# Patient Record
Sex: Male | Born: 1940 | Race: White | Hispanic: No | Marital: Married | Smoking: Former smoker
Health system: Southern US, Community
[De-identification: ages and names within clinical notes are randomized; demographics above are authoritative.]

## PROBLEM LIST (undated history)

## (undated) DIAGNOSIS — I4891 Unspecified atrial fibrillation: Secondary | ICD-10-CM

## (undated) DIAGNOSIS — N401 Enlarged prostate with lower urinary tract symptoms: Secondary | ICD-10-CM

## (undated) DIAGNOSIS — N2889 Other specified disorders of kidney and ureter: Secondary | ICD-10-CM

## (undated) DIAGNOSIS — S37012A Minor contusion of left kidney, initial encounter: Secondary | ICD-10-CM

## (undated) DIAGNOSIS — I456 Pre-excitation syndrome: Secondary | ICD-10-CM

## (undated) DIAGNOSIS — I82409 Acute embolism and thrombosis of unspecified deep veins of unspecified lower extremity: Secondary | ICD-10-CM

## (undated) DIAGNOSIS — N281 Cyst of kidney, acquired: Secondary | ICD-10-CM

## (undated) DIAGNOSIS — N138 Other obstructive and reflux uropathy: Secondary | ICD-10-CM

## (undated) DIAGNOSIS — N184 Chronic kidney disease, stage 4 (severe): Secondary | ICD-10-CM

## (undated) DIAGNOSIS — I2699 Other pulmonary embolism without acute cor pulmonale: Secondary | ICD-10-CM

## (undated) DIAGNOSIS — C679 Malignant neoplasm of bladder, unspecified: Secondary | ICD-10-CM

## (undated) DIAGNOSIS — R972 Elevated prostate specific antigen [PSA]: Secondary | ICD-10-CM

## (undated) HISTORY — PX: HERNIA REPAIR: SHX51

---

## 1999-11-16 HISTORY — PX: TRANSURETHRAL NEEDLE ABLATION OF THE PROSTATE: SHX2574

## 2003-11-09 ENCOUNTER — Observation Stay (HOSPITAL_COMMUNITY): Admission: EM | Admit: 2003-11-09 | Discharge: 2003-11-09 | Payer: Self-pay | Admitting: *Deleted

## 2004-08-10 ENCOUNTER — Encounter: Admission: RE | Admit: 2004-08-10 | Discharge: 2004-08-10 | Payer: Self-pay | Admitting: Nephrology

## 2006-01-16 ENCOUNTER — Ambulatory Visit: Payer: Self-pay | Admitting: Cardiology

## 2006-01-16 ENCOUNTER — Ambulatory Visit (HOSPITAL_COMMUNITY): Admission: RE | Admit: 2006-01-16 | Discharge: 2006-01-16 | Payer: Self-pay | Admitting: Family Medicine

## 2006-01-17 ENCOUNTER — Inpatient Hospital Stay (HOSPITAL_COMMUNITY): Admission: EM | Admit: 2006-01-17 | Discharge: 2006-01-22 | Payer: Self-pay | Admitting: Emergency Medicine

## 2006-01-24 ENCOUNTER — Ambulatory Visit: Payer: Self-pay | Admitting: Cardiology

## 2006-02-01 ENCOUNTER — Ambulatory Visit: Payer: Self-pay | Admitting: Cardiology

## 2006-02-08 ENCOUNTER — Ambulatory Visit: Payer: Self-pay | Admitting: Cardiology

## 2006-02-22 ENCOUNTER — Ambulatory Visit: Payer: Self-pay | Admitting: Cardiology

## 2006-03-14 ENCOUNTER — Ambulatory Visit: Payer: Self-pay | Admitting: Cardiology

## 2006-03-28 ENCOUNTER — Ambulatory Visit: Payer: Self-pay | Admitting: Cardiology

## 2006-04-18 ENCOUNTER — Ambulatory Visit: Payer: Self-pay | Admitting: Cardiology

## 2006-05-16 ENCOUNTER — Ambulatory Visit: Payer: Self-pay | Admitting: Cardiovascular Disease

## 2006-06-13 ENCOUNTER — Ambulatory Visit: Payer: Self-pay | Admitting: Cardiovascular Disease

## 2006-06-28 ENCOUNTER — Ambulatory Visit: Payer: Self-pay | Admitting: Cardiology

## 2006-07-26 ENCOUNTER — Ambulatory Visit: Payer: Self-pay | Admitting: Cardiovascular Disease

## 2006-08-12 ENCOUNTER — Ambulatory Visit: Payer: Self-pay | Admitting: Cardiology

## 2006-09-09 ENCOUNTER — Ambulatory Visit: Payer: Self-pay | Admitting: Internal Medicine

## 2006-10-07 ENCOUNTER — Ambulatory Visit: Payer: Self-pay | Admitting: Cardiology

## 2006-11-04 ENCOUNTER — Ambulatory Visit: Payer: Self-pay | Admitting: Cardiovascular Disease

## 2006-12-02 ENCOUNTER — Ambulatory Visit: Payer: Self-pay | Admitting: Cardiovascular Disease

## 2006-12-30 ENCOUNTER — Ambulatory Visit: Payer: Self-pay | Admitting: Cardiology

## 2007-02-06 ENCOUNTER — Ambulatory Visit: Payer: Self-pay | Admitting: Cardiology

## 2007-03-06 ENCOUNTER — Ambulatory Visit: Payer: Self-pay | Admitting: Cardiology

## 2007-04-03 ENCOUNTER — Ambulatory Visit: Payer: Self-pay | Admitting: Internal Medicine

## 2007-04-17 ENCOUNTER — Ambulatory Visit: Payer: Self-pay | Admitting: Cardiovascular Disease

## 2007-05-15 ENCOUNTER — Ambulatory Visit: Payer: Self-pay | Admitting: Cardiology

## 2007-06-06 ENCOUNTER — Ambulatory Visit: Payer: Self-pay | Admitting: Internal Medicine

## 2007-07-04 ENCOUNTER — Ambulatory Visit: Payer: Self-pay | Admitting: Cardiology

## 2007-07-18 ENCOUNTER — Ambulatory Visit: Payer: Self-pay | Admitting: Cardiology

## 2007-08-15 ENCOUNTER — Ambulatory Visit: Payer: Self-pay | Admitting: Cardiology

## 2007-09-12 ENCOUNTER — Ambulatory Visit: Payer: Self-pay | Admitting: Cardiology

## 2007-10-10 ENCOUNTER — Ambulatory Visit: Payer: Self-pay | Admitting: Internal Medicine

## 2007-10-31 ENCOUNTER — Ambulatory Visit: Payer: Self-pay | Admitting: Internal Medicine

## 2007-11-28 ENCOUNTER — Ambulatory Visit: Payer: Self-pay | Admitting: Cardiovascular Disease

## 2007-12-26 ENCOUNTER — Ambulatory Visit: Payer: Self-pay | Admitting: Cardiovascular Disease

## 2008-02-12 ENCOUNTER — Ambulatory Visit: Payer: Self-pay | Admitting: Cardiology

## 2008-02-14 ENCOUNTER — Ambulatory Visit: Payer: Self-pay | Admitting: Cardiology

## 2008-03-19 ENCOUNTER — Ambulatory Visit: Payer: Self-pay | Admitting: Cardiology

## 2008-04-16 ENCOUNTER — Ambulatory Visit: Payer: Self-pay | Admitting: Cardiology

## 2008-05-14 ENCOUNTER — Ambulatory Visit: Payer: Self-pay | Admitting: Cardiology

## 2008-06-11 ENCOUNTER — Ambulatory Visit: Payer: Self-pay | Admitting: Internal Medicine

## 2008-07-09 ENCOUNTER — Ambulatory Visit: Payer: Self-pay | Admitting: Cardiology

## 2008-08-06 ENCOUNTER — Ambulatory Visit: Payer: Self-pay | Admitting: Cardiology

## 2008-08-20 ENCOUNTER — Ambulatory Visit: Payer: Self-pay | Admitting: Cardiovascular Disease

## 2008-09-17 ENCOUNTER — Ambulatory Visit: Payer: Self-pay | Admitting: Internal Medicine

## 2008-10-15 ENCOUNTER — Ambulatory Visit: Payer: Self-pay | Admitting: Cardiology

## 2008-10-21 DIAGNOSIS — E785 Hyperlipidemia, unspecified: Secondary | ICD-10-CM | POA: Insufficient documentation

## 2008-10-21 DIAGNOSIS — I4891 Unspecified atrial fibrillation: Secondary | ICD-10-CM

## 2008-10-21 DIAGNOSIS — N184 Chronic kidney disease, stage 4 (severe): Secondary | ICD-10-CM

## 2008-10-21 DIAGNOSIS — I132 Hypertensive heart and chronic kidney disease with heart failure and with stage 5 chronic kidney disease, or end stage renal disease: Secondary | ICD-10-CM

## 2008-10-21 DIAGNOSIS — I2699 Other pulmonary embolism without acute cor pulmonale: Secondary | ICD-10-CM

## 2008-10-21 DIAGNOSIS — R55 Syncope and collapse: Secondary | ICD-10-CM | POA: Insufficient documentation

## 2008-10-21 DIAGNOSIS — I471 Supraventricular tachycardia: Secondary | ICD-10-CM | POA: Insufficient documentation

## 2008-11-12 ENCOUNTER — Ambulatory Visit: Payer: Self-pay | Admitting: Cardiovascular Disease

## 2008-11-15 DIAGNOSIS — S37012A Minor contusion of left kidney, initial encounter: Secondary | ICD-10-CM

## 2008-11-15 HISTORY — DX: Minor contusion of left kidney, initial encounter: S37.012A

## 2008-12-01 ENCOUNTER — Inpatient Hospital Stay (HOSPITAL_COMMUNITY): Admission: EM | Admit: 2008-12-01 | Discharge: 2008-12-05 | Payer: Self-pay | Admitting: Emergency Medicine

## 2008-12-01 ENCOUNTER — Ambulatory Visit: Payer: Self-pay | Admitting: Cardiology

## 2008-12-10 ENCOUNTER — Ambulatory Visit: Payer: Self-pay | Admitting: Cardiology

## 2008-12-10 ENCOUNTER — Inpatient Hospital Stay (HOSPITAL_COMMUNITY): Admission: EM | Admit: 2008-12-10 | Discharge: 2008-12-18 | Payer: Self-pay | Admitting: Emergency Medicine

## 2008-12-10 ENCOUNTER — Encounter (INDEPENDENT_AMBULATORY_CARE_PROVIDER_SITE_OTHER): Payer: Self-pay | Admitting: Nephrology

## 2008-12-10 ENCOUNTER — Encounter (INDEPENDENT_AMBULATORY_CARE_PROVIDER_SITE_OTHER): Payer: Self-pay | Admitting: Emergency Medicine

## 2008-12-10 ENCOUNTER — Ambulatory Visit: Payer: Self-pay | Admitting: Surgery

## 2008-12-16 ENCOUNTER — Encounter: Payer: Self-pay | Admitting: Internal Medicine

## 2008-12-20 ENCOUNTER — Ambulatory Visit: Payer: Self-pay | Admitting: Cardiovascular Disease

## 2008-12-25 ENCOUNTER — Ambulatory Visit: Payer: Self-pay | Admitting: Cardiology

## 2009-01-08 ENCOUNTER — Ambulatory Visit: Payer: Self-pay | Admitting: Cardiology

## 2009-01-13 ENCOUNTER — Ambulatory Visit: Payer: Self-pay | Admitting: Cardiology

## 2009-01-27 ENCOUNTER — Ambulatory Visit: Payer: Self-pay | Admitting: Cardiovascular Disease

## 2009-02-05 ENCOUNTER — Ambulatory Visit: Payer: Self-pay | Admitting: Internal Medicine

## 2009-02-25 ENCOUNTER — Ambulatory Visit: Payer: Self-pay | Admitting: Cardiology

## 2009-03-17 ENCOUNTER — Ambulatory Visit: Payer: Self-pay | Admitting: Cardiology

## 2009-03-21 ENCOUNTER — Ambulatory Visit: Payer: Self-pay

## 2009-03-21 ENCOUNTER — Ambulatory Visit: Payer: Self-pay | Admitting: Cardiovascular Disease

## 2009-04-04 ENCOUNTER — Ambulatory Visit: Payer: Self-pay | Admitting: Cardiology

## 2009-04-15 ENCOUNTER — Encounter: Payer: Self-pay | Admitting: *Deleted

## 2009-04-21 ENCOUNTER — Ambulatory Visit: Payer: Self-pay | Admitting: Cardiovascular Disease

## 2009-04-28 ENCOUNTER — Ambulatory Visit: Payer: Self-pay | Admitting: Cardiology

## 2009-04-28 DIAGNOSIS — R233 Spontaneous ecchymoses: Secondary | ICD-10-CM | POA: Insufficient documentation

## 2009-04-28 LAB — CONVERTED CEMR LAB
INR: 1.2
POC INR: 1.2

## 2009-04-30 LAB — CONVERTED CEMR LAB
Basophils Relative: 1.8 % (ref 0.0–3.0)
Eosinophils Relative: 3.7 % (ref 0.0–5.0)
Hemoglobin: 12.5 g/dL — ABNORMAL LOW (ref 13.0–17.0)
Lymphocytes Relative: 26.3 % (ref 12.0–46.0)
Monocytes Relative: 7.6 % (ref 3.0–12.0)
Neutro Abs: 3.7 10*3/uL (ref 1.4–7.7)
RBC: 4.02 M/uL — ABNORMAL LOW (ref 4.22–5.81)

## 2009-05-05 ENCOUNTER — Ambulatory Visit: Payer: Self-pay | Admitting: Internal Medicine

## 2009-05-05 ENCOUNTER — Ambulatory Visit: Payer: Self-pay | Admitting: Cardiology

## 2009-05-05 DIAGNOSIS — D649 Anemia, unspecified: Secondary | ICD-10-CM | POA: Insufficient documentation

## 2009-05-05 LAB — CONVERTED CEMR LAB
Basophils Relative: 0.7 % (ref 0.0–3.0)
Eosinophils Relative: 2.8 % (ref 0.0–5.0)
HCT: 36.3 % — ABNORMAL LOW (ref 39.0–52.0)
Hemoglobin: 12.8 g/dL — ABNORMAL LOW (ref 13.0–17.0)
MCV: 91.3 fL (ref 78.0–100.0)
Monocytes Absolute: 0.5 10*3/uL (ref 0.1–1.0)
Neutrophils Relative %: 62.9 % (ref 43.0–77.0)
RBC: 3.98 M/uL — ABNORMAL LOW (ref 4.22–5.81)
WBC: 6.5 10*3/uL (ref 4.5–10.5)

## 2009-05-16 ENCOUNTER — Ambulatory Visit: Payer: Self-pay | Admitting: Cardiology

## 2009-05-16 LAB — CONVERTED CEMR LAB: Prothrombin Time: 24.4 s

## 2009-05-21 ENCOUNTER — Encounter: Payer: Self-pay | Admitting: *Deleted

## 2009-05-26 ENCOUNTER — Ambulatory Visit: Payer: Self-pay | Admitting: Cardiology

## 2009-06-16 ENCOUNTER — Ambulatory Visit: Payer: Self-pay | Admitting: Internal Medicine

## 2009-06-16 LAB — CONVERTED CEMR LAB
POC INR: 2.1
Prothrombin Time: 17.9 s

## 2009-07-14 ENCOUNTER — Ambulatory Visit: Payer: Self-pay | Admitting: Cardiovascular Disease

## 2009-08-11 ENCOUNTER — Ambulatory Visit: Payer: Self-pay | Admitting: Cardiology

## 2009-08-18 ENCOUNTER — Encounter: Payer: Self-pay | Admitting: Cardiology

## 2009-08-21 ENCOUNTER — Ambulatory Visit: Payer: Self-pay | Admitting: Cardiology

## 2009-08-21 ENCOUNTER — Ambulatory Visit: Payer: Self-pay | Admitting: Internal Medicine

## 2009-08-21 DIAGNOSIS — E663 Overweight: Secondary | ICD-10-CM | POA: Insufficient documentation

## 2009-08-21 LAB — CONVERTED CEMR LAB: POC INR: 2.5

## 2009-10-01 ENCOUNTER — Ambulatory Visit: Payer: Self-pay | Admitting: Cardiovascular Disease

## 2009-10-28 ENCOUNTER — Ambulatory Visit: Payer: Self-pay | Admitting: Cardiology

## 2009-11-18 ENCOUNTER — Ambulatory Visit: Payer: Self-pay | Admitting: Cardiology

## 2009-11-18 LAB — CONVERTED CEMR LAB: POC INR: 2.2

## 2009-12-16 ENCOUNTER — Ambulatory Visit: Payer: Self-pay | Admitting: Internal Medicine

## 2009-12-31 ENCOUNTER — Telehealth (INDEPENDENT_AMBULATORY_CARE_PROVIDER_SITE_OTHER): Payer: Self-pay | Admitting: *Deleted

## 2009-12-31 ENCOUNTER — Ambulatory Visit: Payer: Self-pay | Admitting: Internal Medicine

## 2009-12-31 LAB — CONVERTED CEMR LAB: POC INR: 4.1

## 2010-01-13 ENCOUNTER — Ambulatory Visit: Payer: Self-pay | Admitting: Cardiovascular Disease

## 2010-01-13 LAB — CONVERTED CEMR LAB: POC INR: 3.9

## 2010-01-27 ENCOUNTER — Ambulatory Visit: Payer: Self-pay | Admitting: Cardiology

## 2010-01-27 LAB — CONVERTED CEMR LAB: POC INR: 2.4

## 2010-02-17 ENCOUNTER — Ambulatory Visit: Payer: Self-pay | Admitting: Internal Medicine

## 2010-02-17 LAB — CONVERTED CEMR LAB: POC INR: 2.3

## 2010-03-18 ENCOUNTER — Ambulatory Visit: Payer: Self-pay | Admitting: Cardiology

## 2010-03-18 LAB — CONVERTED CEMR LAB: POC INR: 3

## 2010-04-08 ENCOUNTER — Ambulatory Visit: Payer: Self-pay | Admitting: Internal Medicine

## 2010-04-22 ENCOUNTER — Ambulatory Visit: Payer: Self-pay | Admitting: Cardiovascular Disease

## 2010-05-20 ENCOUNTER — Ambulatory Visit: Payer: Self-pay | Admitting: Internal Medicine

## 2010-05-20 LAB — CONVERTED CEMR LAB: POC INR: 2.5

## 2010-06-17 ENCOUNTER — Ambulatory Visit: Payer: Self-pay | Admitting: Cardiovascular Disease

## 2010-07-08 ENCOUNTER — Ambulatory Visit: Payer: Self-pay | Admitting: Internal Medicine

## 2010-07-29 ENCOUNTER — Ambulatory Visit: Payer: Self-pay | Admitting: Internal Medicine

## 2010-08-20 ENCOUNTER — Ambulatory Visit (HOSPITAL_COMMUNITY): Admission: RE | Admit: 2010-08-20 | Discharge: 2010-08-20 | Payer: Self-pay | Admitting: Urology

## 2010-08-26 ENCOUNTER — Ambulatory Visit: Payer: Self-pay | Admitting: Cardiovascular Disease

## 2010-08-26 ENCOUNTER — Ambulatory Visit: Payer: Self-pay | Admitting: Cardiology

## 2010-09-04 IMAGING — CT CT ANGIO ABDOMEN
2 of 8 series · 14 of 46 positions shown, 19 images · IV contrast (agent unspecified)
Comparison: The CT abdomen pelvis of 12/01/2008

CLINICAL DATA: Left retroperitoneal bleed, evaluate for active
bleeding

CT ANGIOGRAPHY ABDOMEN
TECHNIQUE: Multidetector CT imaging of the abdomen was performed
using the standard protocol during bolus administration of
intravenous contrast. Multiplanar reconstructed images including
MIPs were obtained and reviewed to evaluate the vascular anatomy.
Contrast: 100 ml Dmnipaque-YGG the

[Series 7: abd cta 2.0 (id) · axial · 0.82mm/px · z∈[-181,+113]mm · 11 of 167 slices shown, 16 images]
[im 10/167  soft-tissue]
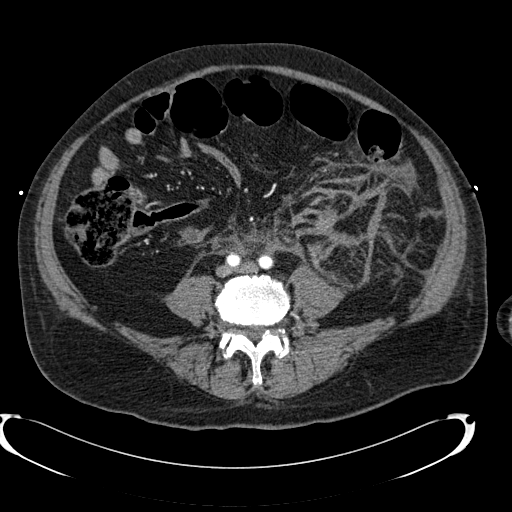
[im 10/167  bone]
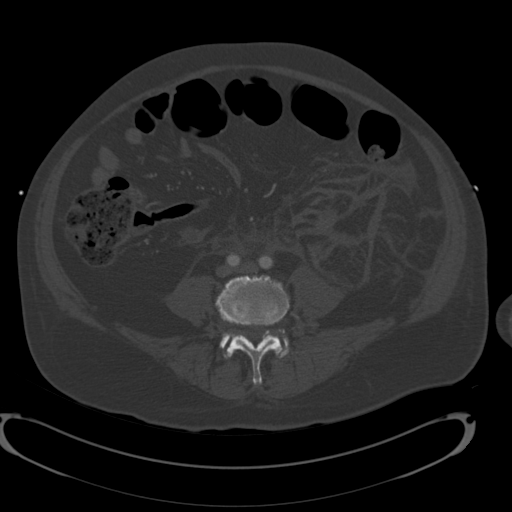
[im 30/167  soft-tissue]
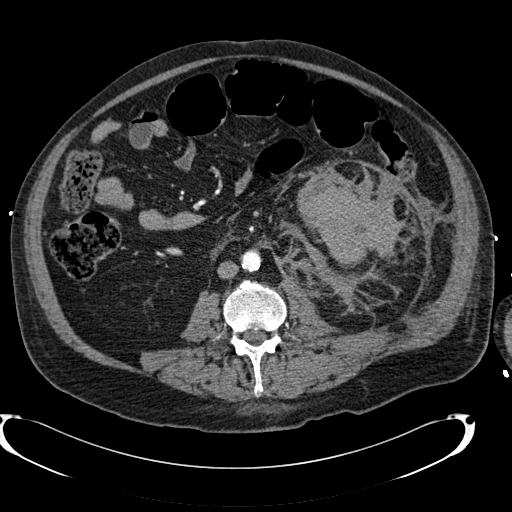
[im 49/167  soft-tissue]
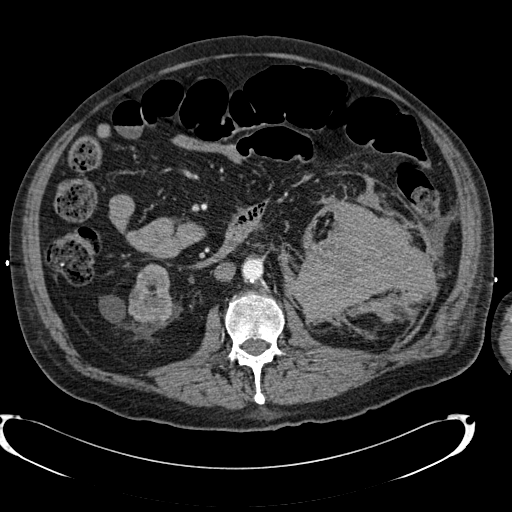
[im 59/167  soft-tissue]
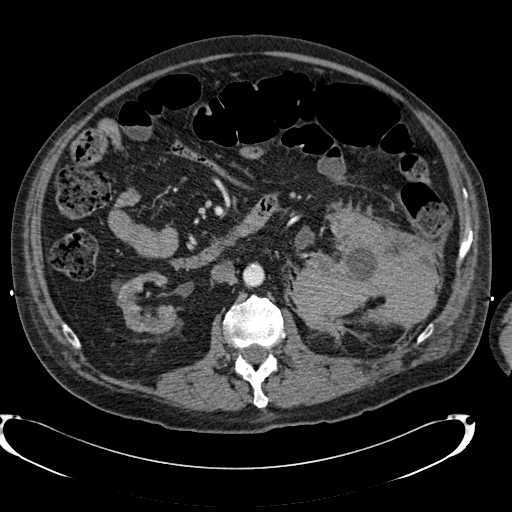
[im 79/167  soft-tissue]
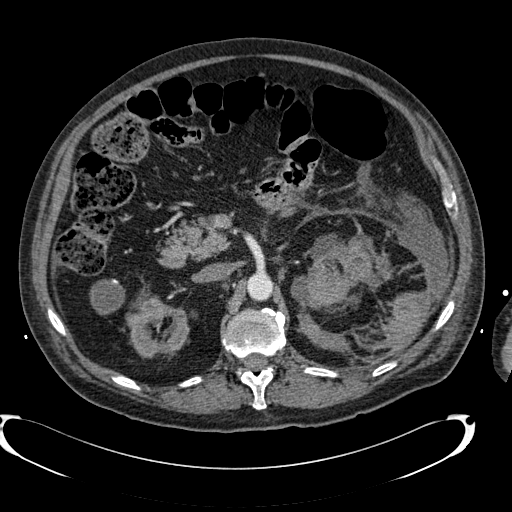
[im 88/167  soft-tissue]
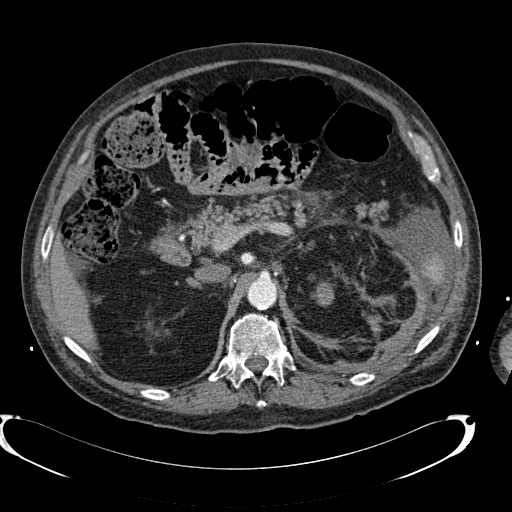
[im 108/167  soft-tissue]
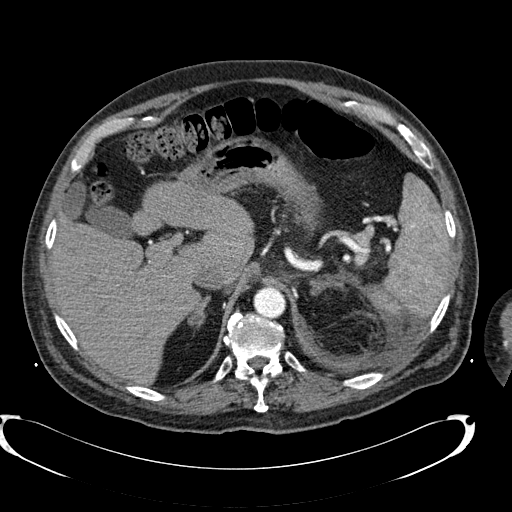
[im 127/167  soft-tissue]
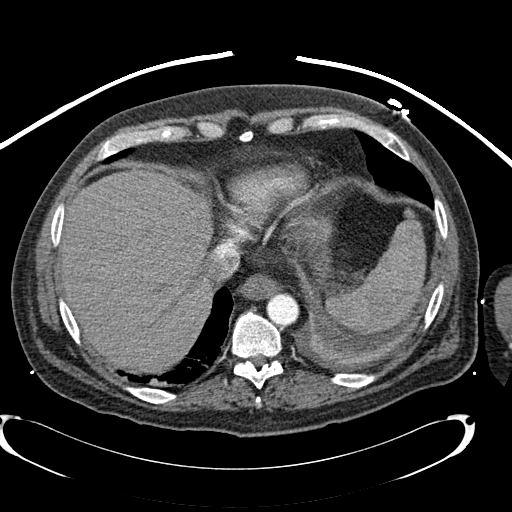
[im 127/167  lung]
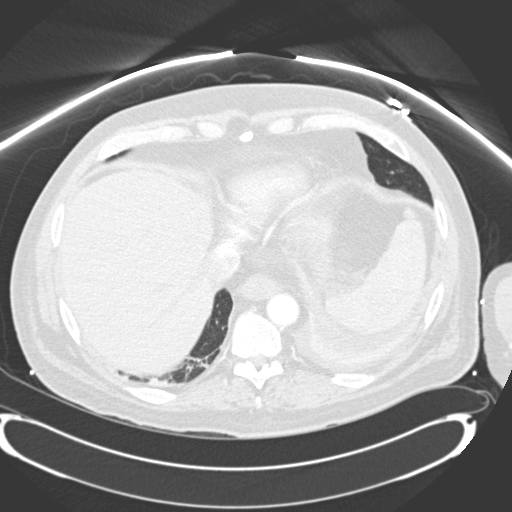
[im 137/167  soft-tissue]
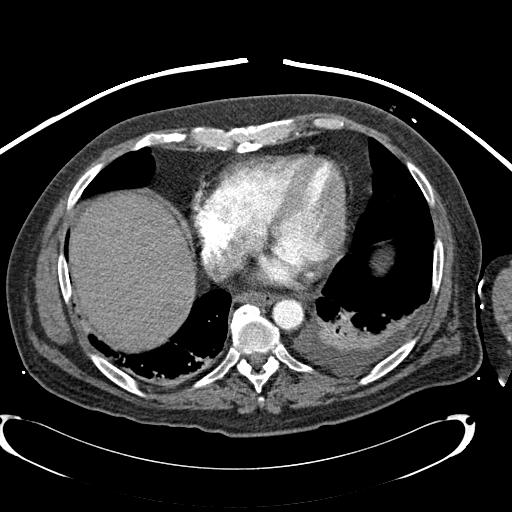
[im 137/167  lung]
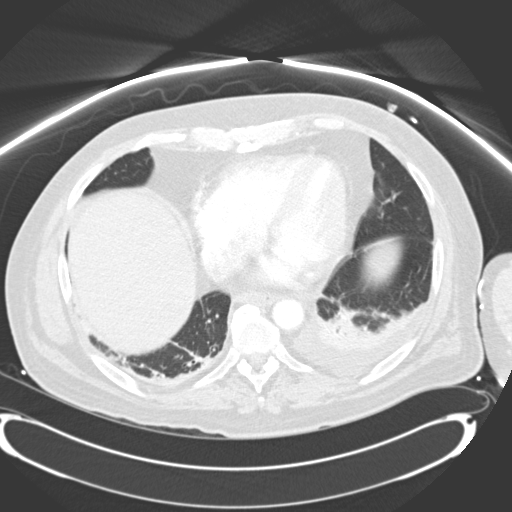
[im 137/167  bone]
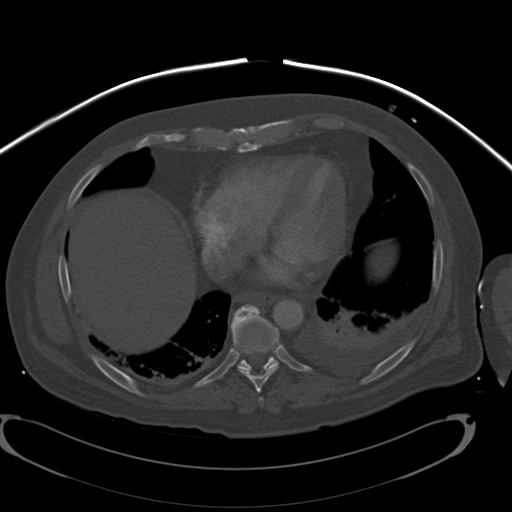
[im 147/167  lung]
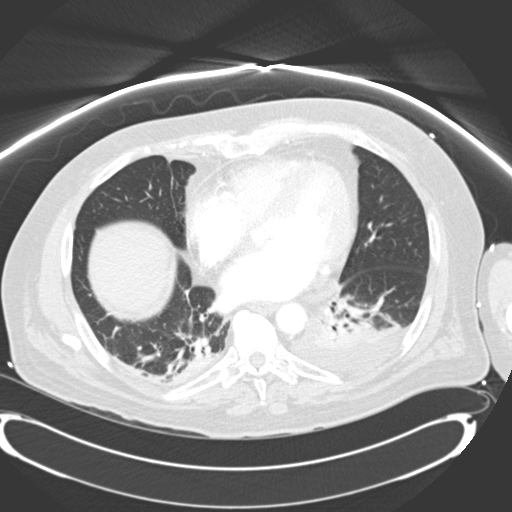
[im 157/167  soft-tissue]
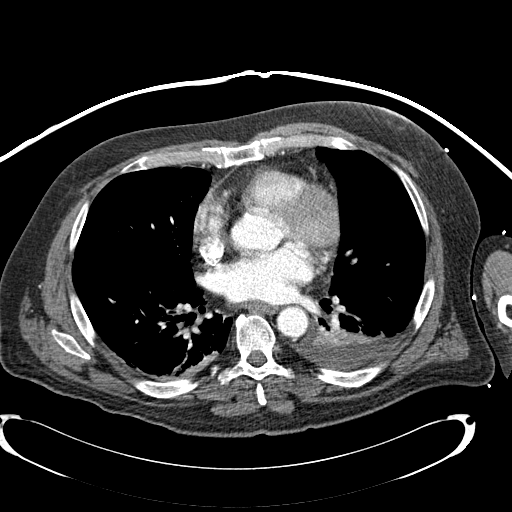
[im 157/167  lung]
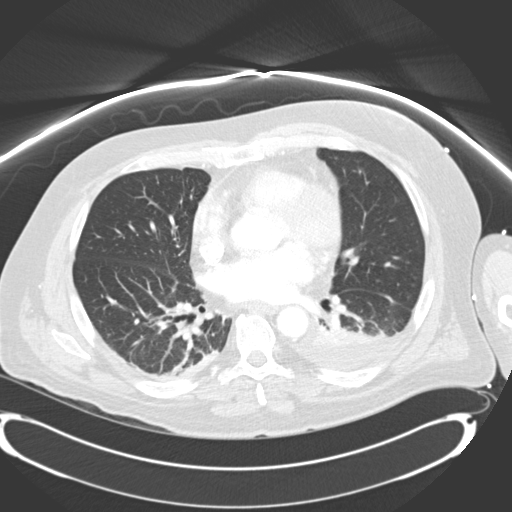

[Series 602: cor · coronal · 0.78mm/px · 3 of 150 slices shown]
[im 38/150  soft-tissue]
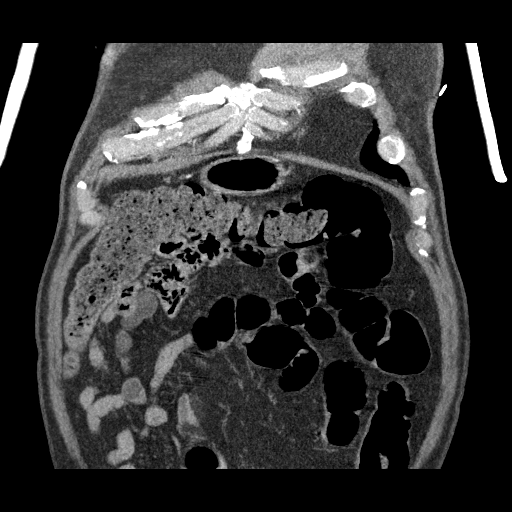
[im 75/150  soft-tissue]
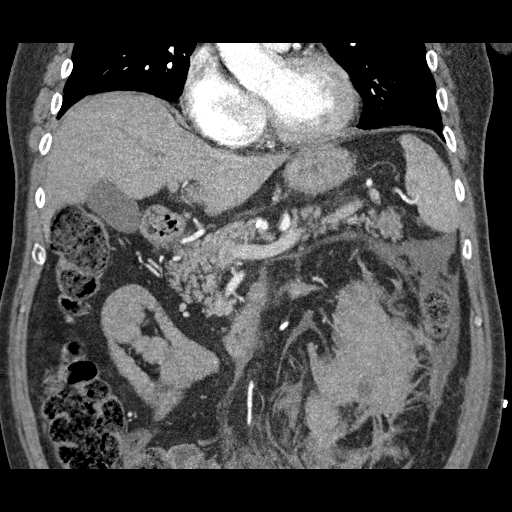
[im 112/150  soft-tissue]
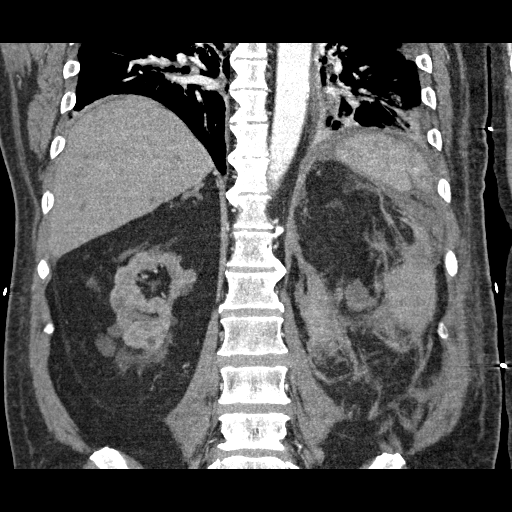

[14 of 46 positions shown; findings below may reference images not displayed]

FINDINGS: There is a left pleural effusion present. Bibasilar
atelectasis is noted left greater than right.  Pneumonia cannot
exclude particularly at the left lung base involving the posterior
left lower lobe. The origins of the great vessels are patent.  The
liver enhances with no focal abnormality.  No calcified gallstones
are seen.  The pancreas is normal in size and the pancreatic duct
is not dilated.  The adrenal glands and spleen appear stable.  As
noted previously there is a large left perinephric hematoma present
. On the arterial phase images there appear to be three left renal
arteries present.  However no active extravasation is seen.
Multiple low attenuation renal lesions are present, one on the
right with peripheral calcification.  A small amount of ascites is
noted along the tip of the spleen inferiorly.  The IMA is patent.
The left retroperitoneal hematoma extends into the pelvis.
IMPRESSION: No active extravasation is seen.  There has been no change in the
size of the left perinephric / left retroperitoneal hematoma.

## 2010-09-04 IMAGING — CT CT PELVIS W/O CM
2 of 4 series · 16 of 46 positions shown, 18 images · non-contrast
Comparison: Abdominal MRI 08/10/2004.

CT ABDOMEN

CLINICAL DATA: Left back and flank pain.  History of renal failure
and kidney stones.

CT OF THE ABDOMEN AND PELVIS WITHOUT CONTRAST (CT UROGRAM)
TECHNIQUE: Multidetector CT imaging was performed through the
abdomen and pelvis to include the urinary tract.

[Series 3: >200 +reformat 5.0 b31f st · axial · 0.81mm/px · z∈[+792,+1252]mm · 13 of 102 slices shown, 15 images]
[im 5/102  soft-tissue]
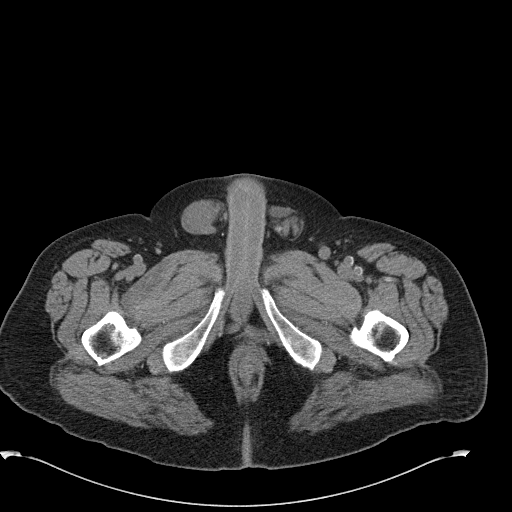
[im 5/102  bone]
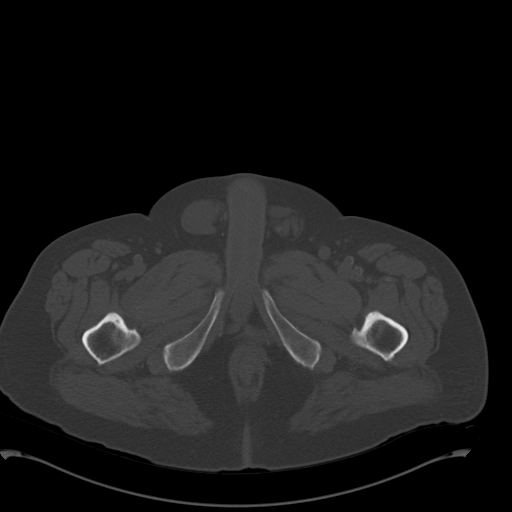
[im 13/102  soft-tissue]
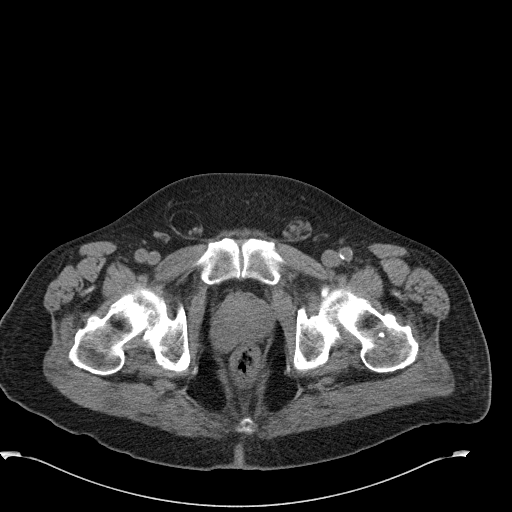
[im 22/102  soft-tissue]
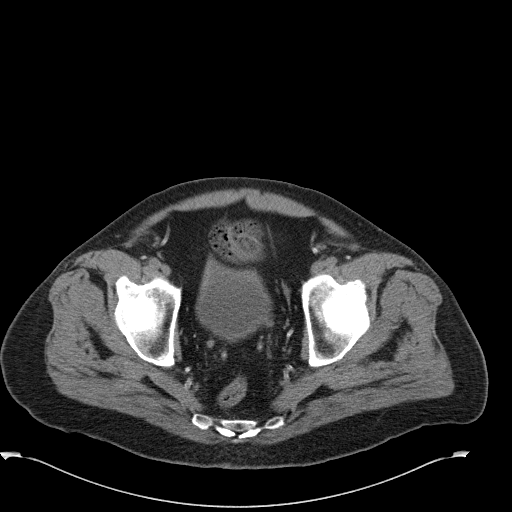
[im 30/102  soft-tissue]
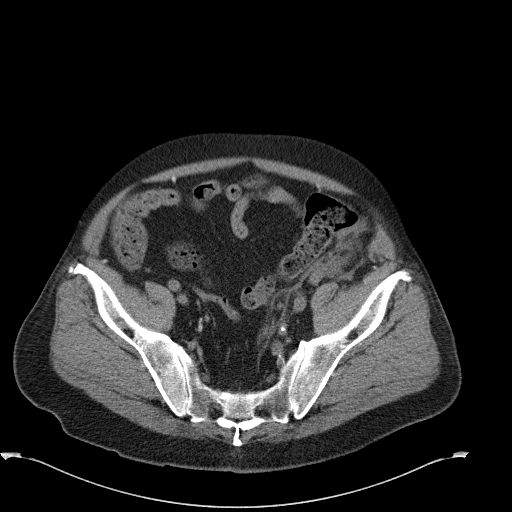
[im 34/102  soft-tissue]
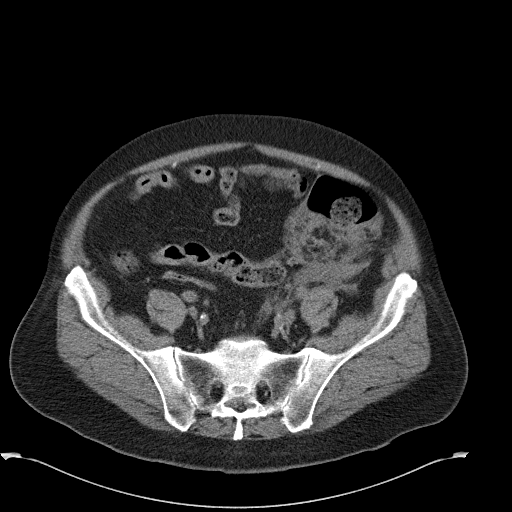
[im 43/102  soft-tissue]
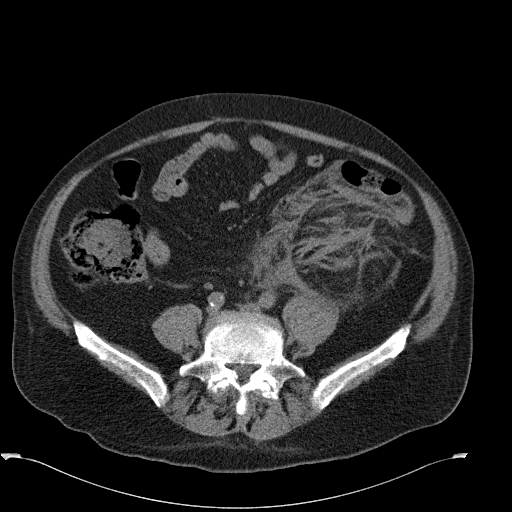
[im 51/102  soft-tissue]
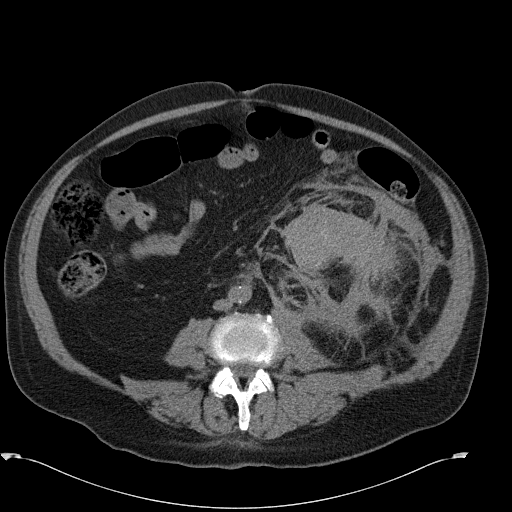
[im 59/102  soft-tissue]
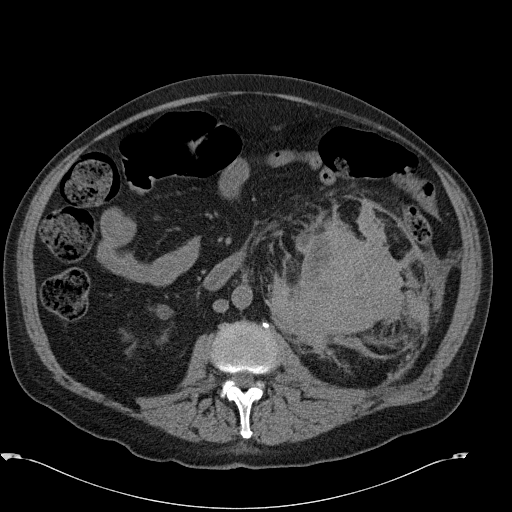
[im 68/102  soft-tissue]
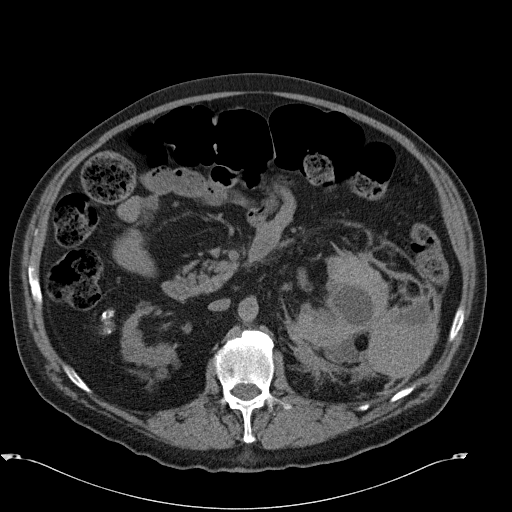
[im 68/102  bone]
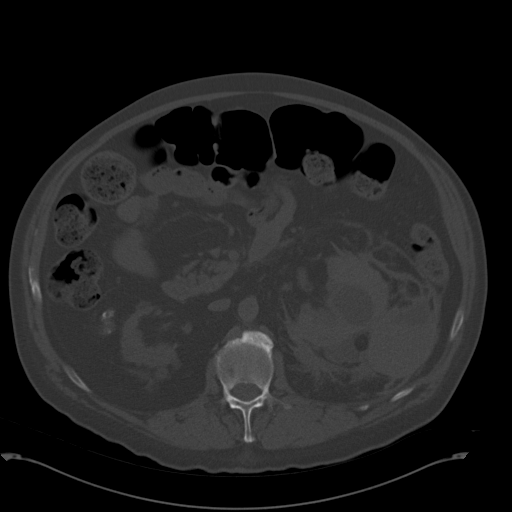
[im 72/102  soft-tissue]
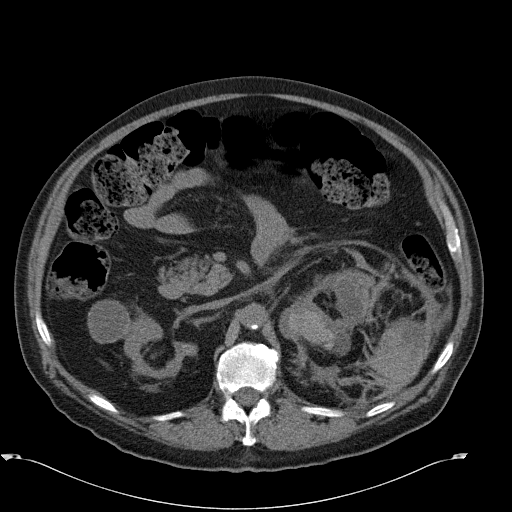
[im 80/102  soft-tissue]
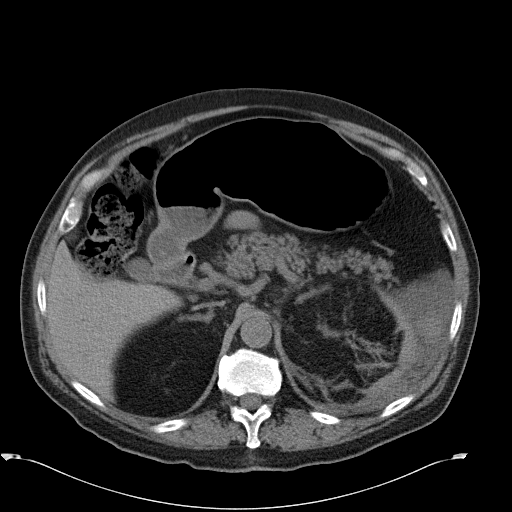
[im 89/102  soft-tissue]
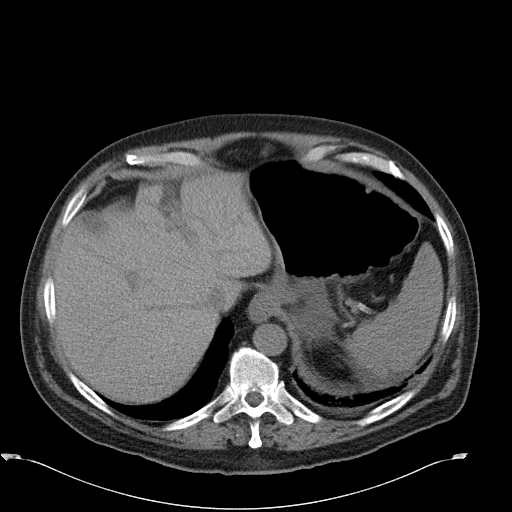
[im 97/102  soft-tissue]
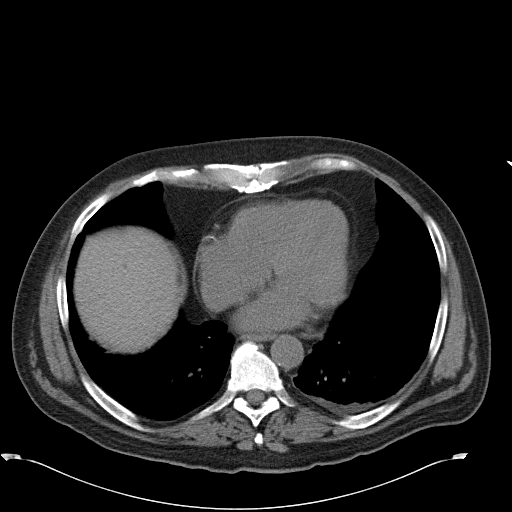

[Series 5: >200 +reformat 2.0 spo cor st · coronal · 0.99mm/px · 3 of 136 slices shown]
[im 46/136  soft-tissue]
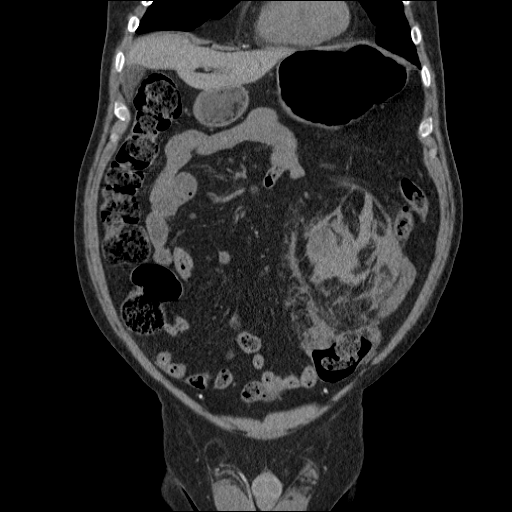
[im 61/136  soft-tissue]
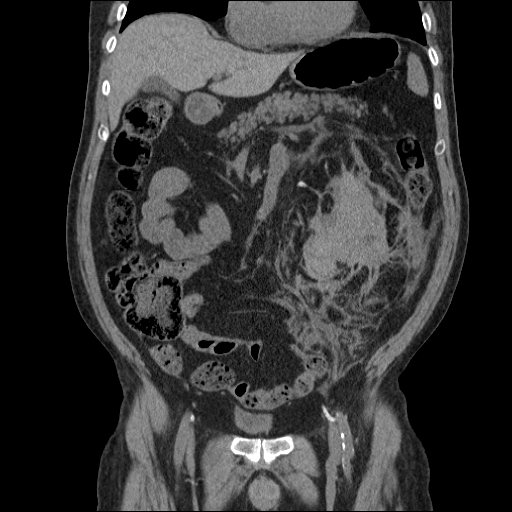
[im 76/136  soft-tissue]
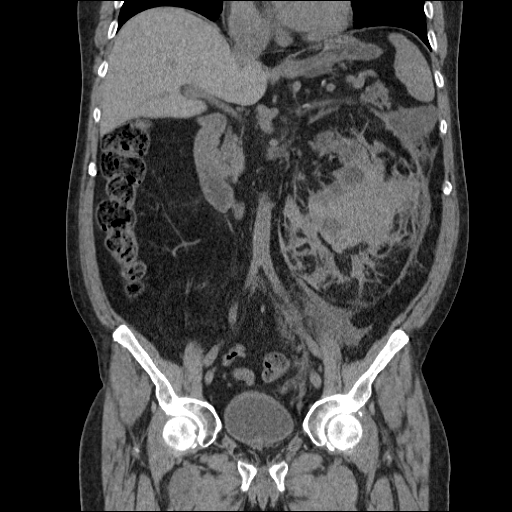

[16 of 46 positions shown; findings below may reference images not displayed]

FINDINGS: There is atelectasis at both lung bases with a small
left pleural effusion.  There is a large complex retroperitoneal
hematoma on the left which obscures the left kidney.  This hematoma
measures approximately 12.0 x 9.3 cm transverse.  There are fluid
fluid levels within the left perinephric space.  As correlated with
the prior examination, the underlying kidney demonstrates multiple
cystic lesions.  No renal or ureteral calculi are identified.

The right kidney demonstrates cortical thinning with multiple low-
density lesions.  A complex peripherally calcified cyst extending
from the interpolar region of the right kidney measures 3.8 x
cm on image 32, not significantly changed from the prior MRI.

There is no generalized ascites.  A small amount of fluid is
present in the left pericolic gutter and the lesser sac.  The
liver, spleen, gallbladder, pancreas and adrenal glands appear
unremarkable.  No bowel lesions are identified.
IMPRESSION: 1.  Large acute perinephric hematoma on the left, largely obscuring
the left kidney.  This hemorrhage likely arises from the left
kidney.  A specific etiology is not demonstrated by this
examination.  Followup is warranted.
2.  No evidence of urinary tract calculus or hydronephrosis.
3.  Grossly stable appearance of the right kidney with multiple
complex cysts.

CT PELVIS
FINDINGS: The perinephric hematoma on the left extends into the
false pelvis.  There is no evidence of ureteral dilatation or
ureteral calculus.  The bladder and prostate gland appear
unremarkable.  There is no pelvic ascites.
IMPRESSION: 1.  Inferior extension of perinephric hematoma into the false
pelvis on the left.
2.  No evidence of ureteral calculus or hydronephrosis.

Critical test results telephoned to Dr. Choy at the time of
interpretation on 12/01/2008 at 8288 hours.

## 2010-09-23 ENCOUNTER — Ambulatory Visit: Payer: Self-pay | Admitting: Cardiovascular Disease

## 2010-09-30 ENCOUNTER — Ambulatory Visit: Payer: Self-pay | Admitting: Cardiology

## 2010-09-30 LAB — CONVERTED CEMR LAB: POC INR: 2.6

## 2010-10-14 ENCOUNTER — Ambulatory Visit: Payer: Self-pay | Admitting: Internal Medicine

## 2010-10-14 LAB — CONVERTED CEMR LAB: POC INR: 2.2

## 2010-11-04 ENCOUNTER — Ambulatory Visit: Payer: Self-pay | Admitting: Cardiology

## 2010-11-04 LAB — CONVERTED CEMR LAB: POC INR: 2

## 2010-12-02 ENCOUNTER — Ambulatory Visit: Admission: RE | Admit: 2010-12-02 | Discharge: 2010-12-02 | Payer: Self-pay | Source: Home / Self Care

## 2010-12-02 LAB — CONVERTED CEMR LAB: POC INR: 1.4

## 2010-12-15 NOTE — Medication Information (Signed)
Summary: rov/tm  Anticoagulant Therapy  Managed by: Bethena Midget, RN, BSN Referring MD: Rollene Rotunda MD PCP: Dr. 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 no    Recent/future dental: no  Any missed doses?: no       Is patient compliant with meds? yes      Comments: Pt will increase leafy veggies  Allergies: No Known Drug Allergies  Anticoagulation Management History:      The patient is taking warfarin and comes in today for a routine follow up visit.  Positive risk factors for bleeding include an age of 70 years or older.  The bleeding index is 'intermediate risk'.  Negative CHADS2 values include Age > 1 years old.  The start date was 01/18/2006.  His last INR was 1.2.  Anticoagulation responsible Chon Buhl: Riley Kill MD, Maisie Fus.  INR POC: 2.7.  Cuvette Lot#: [id number].  Exp: 08/2011.    Anticoagulation Management Assessment/Plan:      The patient's current anticoagulation dose is Coumadin 5 mg tabs: Take as directed by coumadin clinic..  The target INR is 2.0-2.5.  The next INR is due 07/08/2010.  Anticoagulation instructions were given to patient.  Results were reviewed/authorized by Bethena Midget, RN, BSN.  He was notified by Bethena Midget, RN, BSN.         Prior Anticoagulation Instructions: INR 2.5 Continue 2.5mg s everyday except 5mg s on Mondays. Recheck in 4 weeks.   Current Anticoagulation Instructions: INR 2.7 On Thursday only take 1mg  then resume 2.5mg s everyday except 5mg s on Mondays. Recheck in 2 weeks.  Sample 1mg s Lot # [id number] Exp 09/2010

## 2010-12-15 NOTE — Medication Information (Signed)
Summary: rov/tm  Anticoagulant Therapy  Managed by: Cloyde Reams, RN, BSN Referring MD: Rollene Rotunda MD PCP: Dr. 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  Positive risk factors for bleeding include an age of 36 years or older.  The bleeding index is 'intermediate risk'.  Negative CHADS2 values include Age > 30 years old.  The start date was 01/18/2006.  His last INR was 1.2.  Anticoagulation responsible provider: Clifton James MD, Cristal Deer.  INR POC: 3.9.  Cuvette Lot#: [id number].  Exp: 03/2011.    Anticoagulation Management Assessment/Plan:      The patient's current anticoagulation dose is Coumadin 5 mg tabs: Take as directed by coumadin clinic..  The target INR is 2.0-2.5.  The next INR is due 01/27/2010.  Anticoagulation instructions were given to patient.  Results were reviewed/authorized by Cloyde Reams, RN, BSN.  He was notified by Cloyde Reams RN.         Prior Anticoagulation Instructions: INR 4.1 Skip today's dose then resume regular dose of 2.5mg s everyday except 5mg s on Mondays and Fridays. Exception this Friday take only 2.5mg s. Recheck in 2 weeks.   Current Anticoagulation Instructions: INR 3.9  Skip tomorrow's dosage of coumadin then start taking 1/2 tablet daily except 1 tablet on Mondays.  Recheck in 2 weeks.

## 2010-12-15 NOTE — Medication Information (Signed)
Summary: rov/ewj  Anticoagulant Therapy  Managed by: Bethena Midget, RN, BSN Referring MD: Rollene Rotunda MD PCP: Dr. 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  Positive risk factors for bleeding include an age of 70 years or older.  The bleeding index is 'intermediate risk'.  Negative CHADS2 values include Age > 5 years old.  The start date was 01/18/2006.  His last INR was 1.2.  Anticoagulation responsible provider: Graciela Husbands MD, Viviann Spare.  INR POC: 2.3.  Cuvette Lot#: [id number].  Exp: 03/2011.    Anticoagulation Management Assessment/Plan:      The patient's current anticoagulation dose is Coumadin 5 mg tabs: Take as directed by coumadin clinic..  The target INR is 2.0-2.5.  The next INR is due 03/17/2010.  Anticoagulation instructions were given to patient.  Results were reviewed/authorized by Bethena Midget, RN, BSN.  He was notified by Bethena Midget, RN, BSN.         Prior Anticoagulation Instructions: INR 2.4  Continue on same dosage 1/2 tablet daily except 1 tablet on Mondays.  Recheck in 3 weeks.    Current Anticoagulation Instructions: INR 2.3 Continue 2.5mg s daily except 5mg s on Mondays. Recheck in 4 weeks.   Appended Document: [facility]    Anticoagulant Therapy  Managed by: Bethena Midget, RN, BSN Referring MD: Rollene Rotunda MD PCP: Dr. 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 no    Recent/future dental: no  Any missed doses?: no       Is patient compliant with meds? yes       Allergies: No Known Drug Allergies  Anticoagulation Management History:      Positive risk factors for bleeding include an age of 70 years or older.  The bleeding index is 'intermediate risk'.  Negative CHADS2 values include Age > 70 years old.  The start date was 01/18/2006.  His last INR was 1.2.  Anticoagulation responsible provider: Graciela Husbands MD, Viviann Spare.  Exp: 03/2011.    Anticoagulation Management Assessment/Plan:      The patient's current anticoagulation dose is Coumadin 5 mg tabs: Take as directed by coumadin clinic..  The target INR is 2.0-2.5.  The next INR is due 03/17/2010.  Anticoagulation instructions were given to patient.  Results were reviewed/authorized by Bethena Midget, RN, BSN.         Prior Anticoagulation Instructions: INR 2.3 Continue 2.5mg s daily except 5mg s on Mondays. Recheck in 4 weeks.

## 2010-12-15 NOTE — Medication Information (Signed)
Summary: [facility]  Anticoagulant Therapy  Managed by: Lyna Poser, PharmD Referring MD: Rollene Rotunda MD PCP: Dr. 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   Any changes in medication regimen? yes       Details: started trimethoprim 2 weeks ago. Has 2 weeks left.   Recent/future dental: no  Any missed doses?: no       Is patient compliant with meds? yes      Comments: [facility] and they said the antibiotic was trimethoprim by itself not bactrim.   Allergies: No Known Drug Allergies  Anticoagulation Management History:      The patient is taking warfarin and comes in today for a routine follow up visit.  Positive risk factors for bleeding include an age of 70 years or older.  The bleeding index is 'intermediate risk'.  Negative CHADS2 values include Age > 47 years old.  The start date was 01/18/2006.  His last INR was 1.2.  Anticoagulation responsible provider: Myrtis Ser MD,Jeffrey.  INR POC: 2.6.  Cuvette Lot#: [id number].  Exp: 09/2011.    Anticoagulation Management Assessment/Plan:      The patient's current anticoagulation dose is Coumadin 5 mg tabs: Take as directed by coumadin clinic., Warfarin sodium 5 mg tabs: Take as directed.  The target INR is 2.0-2.5.  The next INR is due 10/14/2010.  Anticoagulation instructions were given to patient.  Results were reviewed/authorized by Lyna Poser, PharmD.         Prior Anticoagulation Instructions: INR 3.1  Coumadin 5mg  tab, NO COUMADIN today then 1/2 tab each day   Current Anticoagulation  Instructions: INR 2.6 Continue taking a half tablet everyday. Recheck in 2 weeks.

## 2010-12-15 NOTE — Medication Information (Signed)
Summary: rov/ewj  Anticoagulant Therapy  Managed by: Cloyde Reams, RN, BSN Referring MD: Rollene Rotunda MD PCP: Dr. 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 no    Recent/future dental: no  Any missed doses?: no       Is patient compliant with meds? yes       Allergies (verified): No Known Drug Allergies  Anticoagulation Management History:      The patient is taking warfarin and comes in today for a routine follow up visit.  Positive risk factors for bleeding include an age of 69 years or older.  The bleeding index is 'intermediate risk'.  Negative CHADS2 values include Age > 55 years old.  The start date was 01/18/2006.  His last INR was 1.2.  Anticoagulation responsible provider: Clifton James MD, Cristal Deer.  INR POC: 2.4.  Cuvette Lot#: [id number].  Exp: 03/2011.    Anticoagulation Management Assessment/Plan:      The patient's current anticoagulation dose is Coumadin 5 mg tabs: Take as directed by coumadin clinic..  The target INR is 2.0-2.5.  The next INR is due 02/17/2010.  Anticoagulation instructions were given to patient.  Results were reviewed/authorized by Cloyde Reams, RN, BSN.  He was notified by Cloyde Reams RN.         Prior Anticoagulation Instructions: INR 3.9  Skip tomorrow's dosage of coumadin then start taking 1/2 tablet daily except 1 tablet on Mondays.  Recheck in 2 weeks.    Current Anticoagulation Instructions: INR 2.4  Continue on same dosage 1/2 tablet daily except 1 tablet on Mondays.  Recheck in 3 weeks.

## 2010-12-15 NOTE — Medication Information (Signed)
Summary: rov/eac  Anticoagulant Therapy  Managed by: Weston Brass, PharmD Referring MD: Rollene Rotunda MD PCP: Dr. 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 no    Recent/future dental: no  Any missed doses?: no       Is patient compliant with meds? yes       Allergies: No Known Drug Allergies  Anticoagulation Management History:      The patient is taking warfarin and comes in today for a routine follow up visit.  Positive risk factors for bleeding include an age of 51 years or older.  The bleeding index is 'intermediate risk'.  Negative CHADS2 values include Age > 52 years old.  The start date was 01/18/2006.  His last INR was 1.2.  Anticoagulation responsible provider: Jens Som MD, Arlys John.  INR POC: 3.0.  Cuvette Lot#: [id number].  Exp: 04/2011.    Anticoagulation Management Assessment/Plan:      The patient's current anticoagulation dose is Coumadin 5 mg tabs: Take as directed by coumadin clinic..  The target INR is 2.0-2.5.  The next INR is due 04/08/2010.  Anticoagulation instructions were given to patient.  Results were reviewed/authorized by Weston Brass, PharmD.  He was notified by Weston Brass PharmD.         Prior Anticoagulation Instructions: INR 2.3 Continue 2.5mg s daily except 5mg s on Mondays. Recheck in 4 weeks.   Current Anticoagulation Instructions: INR 3.0  Skip tomorrow's dose of Coumadin then resume same dose of 1/2 tablet every day except 1 tablet on Monday

## 2010-12-15 NOTE — Medication Information (Signed)
Summary: [name]  Anticoagulant Therapy  Managed by: Bethena Midget, RN, BSN Referring MD: Rollene Rotunda MD PCP: Dr. 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 no    Recent/future dental: no  Any missed doses?: no       Is patient compliant with meds? yes       Allergies: No Known Drug Allergies  Anticoagulation Management History:      The patient is taking warfarin and comes in today for a routine follow up visit.  Positive risk factors for bleeding include an age of 70 years or older.  The bleeding index is 'intermediate risk'.  Negative CHADS2 values include Age > 64 years old.  The start date was 01/18/2006.  His last INR was 1.2.  Anticoagulation responsible provider: Juanda Chance MD, Smitty Cords.  INR POC: 2.2.  Cuvette Lot#: [id number].  Exp: 12/2010.    Anticoagulation Management Assessment/Plan:      The patient's current anticoagulation dose is Coumadin 5 mg tabs: Take as directed by coumadin clinic..  The target INR is 2.0-2.5.  The next INR is due 12/16/2009.  Anticoagulation instructions were given to patient.  Results were reviewed/authorized by Bethena Midget, RN, BSN.  He was notified by Bethena Midget, RN, BSN.         Prior Anticoagulation Instructions: INR 3.0   Skip tomorrow's dose of coumadin then resume same dosage 1/2 tablet daily except 1 tablet on Mondays and Fridays.   Recheck in 3 weeks.     Current Anticoagulation Instructions: INR 2.2 Continue 1/2 pill everyday except 1pill on Mondays and Fridays. Recheck in 4 weeks.

## 2010-12-15 NOTE — Progress Notes (Signed)
Summary: nose and mouth bleeding. check INR today.   Phone Note Call from Patient   Caller: patient wife Call For: CVRR Summary of Call: Patient hasn't been eating right due to flu.  Has started having gum and nose bleed 1 week ago and Mrs. Grondin just found out.  Initial call taken by: Bethena Midget, RN, BSN,  December 31, 2009 11:43 AM  Follow-up for Phone Call        Will see today.   Follow-up by: Bethena Midget, RN, BSN,  December 31, 2009 11:43 AM

## 2010-12-15 NOTE — Medication Information (Signed)
Summary: rov/tm  Anticoagulant Therapy  Managed by: Bethena Midget, RN, BSN Referring MD: Rollene Rotunda MD PCP: Dr. 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 no    Recent/future dental: no  Any missed doses?: no       Is patient compliant with meds? yes       Allergies: No Known Drug Allergies  Anticoagulation Management History:      The patient is taking warfarin and comes in today for a routine follow up visit.  Positive risk factors for bleeding include an age of 70 years or older.  The bleeding index is 'intermediate risk'.  Negative CHADS2 values include Age > 70 years old.  The start date was 01/18/2006.  His last INR was 1.2.  Anticoagulation responsible provider: Aris Moman MD, Fayrene Fearing.  INR POC: 2.5.  Cuvette Lot#: [id number].  Exp: 07/2011.    Anticoagulation Management Assessment/Plan:      The patient's current anticoagulation dose is Coumadin 5 mg tabs: Take as directed by coumadin clinic..  The target INR is 2.0-2.5.  The next INR is due 06/17/2010.  Anticoagulation instructions were given to patient.  Results were reviewed/authorized by Bethena Midget, RN, BSN.  He was notified by Bethena Midget, RN, BSN.         Prior Anticoagulation Instructions: INR 2.5 Continue 2.5mg s everyday except 5mg s on Mondays. Recheck in 3 weeks.   Current Anticoagulation Instructions: INR 2.5 Continue 2.5mg s everyday except 5mg s on Mondays. Recheck in 4 weeks.

## 2010-12-15 NOTE — Medication Information (Signed)
Summary: rov/mw  Anticoagulant Therapy  Managed by: Weston Brass, PharmD Referring MD: Rollene Rotunda MD PCP: Dr. 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 yes       Details: Still on Trimethoprim, unsure of stop date but dose was decreased by 1/2 due to MD concerns about "blood work"  Recent/future dental: no  Any missed doses?: no       Is patient compliant with meds? yes      Comments: Pt will call us back with instructions for trimethoprim to deteremine if appointment date needs to be changed  Allergies: No Known Drug Allergies  Anticoagulation Management History:      The patient is taking warfarin and comes in today for a routine follow up visit.  Positive risk factors for bleeding include an age of 70 years or older.  The bleeding index is 'intermediate risk'.  Negative CHADS2 values include Age > 10 years old.  The start date was 01/18/2006.  His last INR was 1.2.  Anticoagulation responsible Makailee Nudelman: Bensimhon MD, Reuel Boom.  INR POC: 2.2.  Cuvette Lot#: [id number].  Exp: 10/2011.    Anticoagulation Management Assessment/Plan:      The patient's current anticoagulation dose is Coumadin 5 mg tabs: Take as directed by coumadin clinic., Warfarin sodium 5 mg tabs: Take as directed.  The target INR is 2.0-2.5.  The next INR is due 11/04/2010.  Anticoagulation instructions were given to patient.  Results were reviewed/authorized by Weston Brass, PharmD.  He was notified by Hoy Register, PharmD Candidate.         Prior Anticoagulation Instructions: INR 2.6 Continue taking a half tablet everyday. Recheck in 2 weeks.     Current Anticoagulation Instructions: INR 2.2 Continue previous dose of 0.5 tablet everyday Recheck INR in 3 weeks unless trimethoprim changes

## 2010-12-15 NOTE — Medication Information (Signed)
Summary: rov/tm  Anticoagulant Therapy  Managed by: Bethena Midget, RN, BSN Referring MD: Rollene Rotunda MD PCP: Dr. 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 nostril off and on for about week. Esp when he blows nose. Also gum bleeding   Recent/future hospitalizations: no    Any changes in medication regimen? yes       Details: Tylenol PRN and 2 ASA once at bedtime.   Recent/future dental: no  Any missed doses?: no       Is patient compliant with meds? yes       Allergies: No Known Drug Allergies  Anticoagulation Management History:      The patient comes in today for his initial visit for anticoagulation therapy.  Positive risk factors for bleeding include an age of 70 years or older.  The bleeding index is 'intermediate risk'.  Negative CHADS2 values include Age > 1 years old.  The start date was 01/18/2006.  His last INR was 1.2.  Anticoagulation responsible provider: Lakesha Levinson MD, Fayrene Fearing.  INR POC: 4.1.  Cuvette Lot#: [id number].  Exp: 02/2011.    Anticoagulation Management Assessment/Plan:      The patient's current anticoagulation dose is Coumadin 5 mg tabs: Take as directed by coumadin clinic..  The target INR is 2.0-2.5.  The next INR is due 01/13/2010.  Anticoagulation instructions were given to patient.  Results were reviewed/authorized by Bethena Midget, RN, BSN.  He was notified by Bethena Midget, RN, BSN.         Prior Anticoagulation Instructions: INR 2.0 Continue 2.5mg s everyday except 5mg s on Mondays and Fridays. Recheck in 4 weeks.   Current  Anticoagulation Instructions: INR 4.1 Skip today's dose then resume regular dose of 2.5mg s everyday except 5mg s on Mondays and Fridays. Exception this Friday take only 2.5mg s. Recheck in 2 weeks.

## 2010-12-15 NOTE — Assessment & Plan Note (Signed)
Summary: 1 year 427.31  Medications Added CALCITRIOL 0.5 MCG CAPS (CALCITRIOL) 5 days a week      Allergies Added: NKDA  Visit Type:  Follow-up Primary Provider:  Dr. Fidela Salisbury  CC:  Atrial Fibrillation.  History of Present Illness: The patient  returns for followup. Since I last saw him he has done quite well. He has stable renal insufficiency and reports that his creatinine is around 6 but he has no indication for dialysis. He has tolerated Coumadin and has had no bleeding issues. He denies any chest discomfort, neck or arm discomfort. He denies any palpitations, presyncope or syncope. He has no PND or orthopnea. He is active though he is not walking or exercising routinely.  Current Medications (verified): 1)  Coumadin 5 Mg Tabs (Warfarin Sodium) .... Take As Directed By Coumadin Clinic. 2)  Metoprolol Succinate 100 Mg Xr24h-Tab (Metoprolol Succinate) .... 2 By Mouth Daily 3)  Cardizem Cd 300 Mg Xr24h-Cap (Diltiazem Hcl Coated Beads) .... Daily 4)  Stool Softner .... Daily 5)  Laxative .... As Needed 6)  Calcitriol 0.5 Mcg Caps (Calcitriol) .... 5 Days A Week 7)  Fosrenol 1000 Mg Chew (Lanthanum Carbonate) .Marland Kitchen.. 1 Tablet By Mouth Three Times A Day With Meals  Allergies (verified): No Known Drug Allergies  Past History:  Past Medical History: Reviewed history from 08/21/2009 and no changes required. 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  Status post inferior vena caval     filter December 10, 2008. Previous history of right lower extremity deep vein     thrombosis/pulmonary embolism March 2007. 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 Left heel pain secondary to probable Achilles tendinitis.  Review of Systems       As stated in the HPI and negative for all other systems.   Vital Signs:  Patient profile:   70 year old male Height:      69 inches Weight:      204 pounds BMI:     30.23 Pulse rate:   72 / minute Resp:     16 per minute BP sitting:   132 / 87  (right arm)  Vitals Entered By: Marrion Coy, CNA (August 26, 2010 9:03 AM)  Physical Exam  General:  Well developed, well nourished, in no acute distress. Head:  normocephalic and atraumatic Eyes:  PERRLA/EOM intact; conjunctiva and lids normal. Mouth:  Teeth, gums and palate normal. Oral mucosa normal. Neck:  Neck supple, no JVD. No masses, thyromegaly or abnormal cervical nodes. Chest Wall:  no deformities or breast masses noted Lungs:  Clear bilaterally to auscultation and percussion. Heart:  Non-displaced PMI, chest non-tender;irregular rate and rhythm, S1, S2 without murmurs, rubs or gallops. Carotid upstroke normal, no bruit. Normal abdominal aortic size, no bruits. Femorals normal pulses, no bruits. Mild  edema. Abdomen:  Bowel sounds positive; abdomen soft and non-tender without masses, organomegaly, or hernias noted. No hepatosplenomegaly, obese Msk:  Back normal, normal gait. Muscle strength and tone normal. Extremities:  trace left pedal edema and trace right pedal edema.   Neurologic:  Alert and oriented x 3. Skin:  Intact without lesions or rashes. Psych:  Normal affect.    EKG  Procedure date:  08/26/2010  Findings:  atrial fibrillation, rate 72, right bundle branch block, right axis deviation, no acute ST-T wave changes  Impression & Recommendations:  Problem # 1:  ATRIAL FIBRILLATION (ICD-427.31) He tolerates this rhythm well and has normal rate control and anticoagulation. A change in therapy is indicated.  Problem # 2:  PULMONARY EMBOLISM (ICD-415.19) With this issue he would not be a Pradaxa candidate. He  continues on anticoagulation with Coumadin.  Problem # 3:  * WPW Olegario Messier has had no further symptomatic tachyarrhythmias. No change in therapy is indicated.  Patient Instructions: 1)  Your physician recommends that you schedule a follow-up appointment in: 1 year 2)  Your physician recommends that you continue on your current medications as directed. Please refer to the Current Medication list given to you today.  Appended Document: Orders Update    Clinical Lists Changes  Orders: Added new Service order of EKG w/ Interpretation (93000) - Signed

## 2010-12-15 NOTE — Medication Information (Signed)
Summary: rov/tm  Anticoagulant Therapy  Managed by: Weston Brass, PharmD Referring MD: Rollene Rotunda MD PCP: Dr. 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 no    Recent/future dental: no  Any missed doses?: no       Is patient compliant with meds? yes       Allergies: No Known Drug Allergies  Anticoagulation Management History:      The patient is taking warfarin and comes in today for a routine follow up visit.  Positive risk factors for bleeding include an age of 70 years or older.  The bleeding index is 'intermediate risk'.  Negative CHADS2 values include Age > 82 years old.  The start date was 01/18/2006.  His last INR was 1.2.  Anticoagulation responsible provider: Allred MD, Fayrene Fearing.  INR POC: 2.3.  Cuvette Lot#: [id number].  Exp: 09/2011.    Anticoagulation Management Assessment/Plan:      The patient's current anticoagulation dose is Coumadin 5 mg tabs: Take as directed by coumadin clinic..  The target INR is 2.0-2.5.  The next INR is due 08/26/2010.  Anticoagulation instructions were given to patient.  Results were reviewed/authorized by Weston Brass, PharmD.  He was notified by Weston Brass PharmD.         Prior Anticoagulation Instructions: INR 2.6 Today only take 1mg s then resume 2.5mg s everyday except 5mg s on Mondays. Recheck in 3 weeks.   1mg s Lot # [id number] Exp 09/2010 # 1 tablet  Current Anticoagulation Instructions: INR 2.3  Continue same dose of 1/2 tablets every day except 1 tablet on Monday.  Recheck INR in 4 weeks.

## 2010-12-15 NOTE — Medication Information (Signed)
Summary: CCR      Allergies Added: NKDA Anticoagulant Therapy  Managed by: Reina Fuse, PharmD Referring MD: Rollene Rotunda MD PCP: Dr. 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 no    Recent/future dental: no  Any missed doses?: no       Is patient compliant with meds? yes       Current Medications (verified): 1)  Coumadin 5 Mg Tabs (Warfarin Sodium) .... Take As Directed By Coumadin Clinic. 2)  Metoprolol Succinate 100 Mg Xr24h-Tab (Metoprolol Succinate) .... 2 By Mouth Daily 3)  Cardizem Cd 300 Mg Xr24h-Cap (Diltiazem Hcl Coated Beads) .... Daily 4)  Stool Softner .... Daily 5)  Laxative .... As Needed 6)  Calcitriol 0.5 Mcg Caps (Calcitriol) .... Take One Tablet Daily 7)  Fosrenol 1000 Mg Chew (Lanthanum Carbonate) .Marland Kitchen.. 1 Tablet By Mouth Three Times A Day With Meals  Allergies (verified): No Known Drug Allergies  Anticoagulation Management History:      The patient is taking warfarin and comes in today for a routine follow up visit.  Positive risk factors for bleeding include an age of 70 years or older.  The bleeding index is 'intermediate risk'.  Negative CHADS2 values include Age > 60 years old.  The start date was 01/18/2006.  His last INR was 1.2.  Anticoagulation responsible provider: Eden Emms MD, Theron Arista.  INR POC: 2.5.  Cuvette Lot#: [id number].  Exp: 09/2011.    Anticoagulation Management Assessment/Plan:      The patient's current anticoagulation dose is Coumadin 5 mg tabs: Take as directed by coumadin clinic..  The target INR is 2.0-2.5.  The next INR is due 09/23/2010.  Anticoagulation instructions  were given to patient.  Results were reviewed/authorized by Reina Fuse, PharmD.  He was notified by Reina Fuse PharmD.         Prior Anticoagulation Instructions: INR 2.3  Continue same dose of 1/2 tablets every day except 1 tablet on Monday.  Recheck INR in 4 weeks.   Current Anticoagulation Instructions: INR 2.5  Continue taking Coumadin 0.5 tab (2.5 mg) every day except Coumadin 1 tab (5 mg) on Mondays. Return to clinic in 4 weeks.

## 2010-12-15 NOTE — Medication Information (Signed)
Summary: rov/sl  Medications Added WARFARIN SODIUM 5 MG TABS (WARFARIN SODIUM) Take as directed      Allergies Added: NKDA Anticoagulant Therapy  Managed by: Leota Sauers, PharmD, BCPS, CPP Referring MD: Rollene Rotunda MD PCP: Dr. 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 yes       Details: new ABX unsure name day# 5/30  Recent/future dental: no  Any missed doses?: no       Is patient compliant with meds? yes       Current Medications (verified): 1)  Coumadin 5 Mg Tabs (Warfarin Sodium) .... Take As Directed By Coumadin Clinic. 2)  Metoprolol Succinate 100 Mg Xr24h-Tab (Metoprolol Succinate) .... 2 By Mouth Daily 3)  Cardizem Cd 300 Mg Xr24h-Cap (Diltiazem Hcl Coated Beads) .... Daily 4)  Stool Softner .... Daily 5)  Laxative .... As Needed 6)  Calcitriol 0.5 Mcg Caps (Calcitriol) .... 5 Days A Week 7)  Fosrenol 1000 Mg Chew (Lanthanum Carbonate) .Marland Kitchen.. 1 Tablet By Mouth Three Times A Day With Meals 8)  Warfarin Sodium 5 Mg Tabs (Warfarin Sodium) .... Take As Directed  Allergies (verified): No Known Drug Allergies  Anticoagulation Management History:      Positive risk factors for bleeding include an age of 68 years or older.  The bleeding index is 'intermediate risk'.  Negative CHADS2 values include Age > 10 years old.  The start date was 01/18/2006.  His last INR was 1.2.  Anticoagulation responsible provider: Eden Emms MD, Theron Arista.  INR POC: 3.1.  Exp: 09/2011.    Anticoagulation Management Assessment/Plan:      The patient's current anticoagulation dose is Coumadin 5 mg tabs: Take as directed by coumadin  clinic., Warfarin sodium 5 mg tabs: Take as directed.  The target INR is 2.0-2.5.  The next INR is due 10/07/2010.  Anticoagulation instructions were given to patient.  Results were reviewed/authorized by Leota Sauers, PharmD, BCPS, CPP.         Prior Anticoagulation Instructions: INR 2.5  Continue taking Coumadin 0.5 tab (2.5 mg) every day except Coumadin 1 tab (5 mg) on Mondays. Return to clinic in 4 weeks.   Current Anticoagulation Instructions: INR 3.1  Coumadin 5mg  tab, NO COUMADIN today then 1/2 tab each day   Appended Document: rov/sl pt called later - ABX is Bactrium - continue current dose instructions but will recheck INR in 1week [facility]

## 2010-12-15 NOTE — Medication Information (Signed)
Summary: rov/sp  Anticoagulant Therapy  Managed by: Weston Brass, PharmD Referring MD: Rollene Rotunda MD PCP: Dr. 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 no    Recent/future dental: no  Any missed doses?: no       Is patient compliant with meds? yes       Allergies: No Known Drug Allergies  Anticoagulation Management History:      The patient comes in today for his initial visit for anticoagulation therapy.  Positive risk factors for bleeding include an age of 70 years or older.  The bleeding index is 'intermediate risk'.  Negative CHADS2 values include Age > 57 years old.  The start date was 01/18/2006.  His last INR was 1.2.  Anticoagulation responsible provider: Brystol Wasilewski MD, Fayrene Fearing.  INR POC: 3.5.  Cuvette Lot#: [id number].  Exp: 06/2011.    Anticoagulation Management Assessment/Plan:      The patient's current anticoagulation dose is Coumadin 5 mg tabs: Take as directed by coumadin clinic..  The target INR is 2.0-2.5.  The next INR is due 04/17/2010.  Anticoagulation instructions were given to patient.  Results were reviewed/authorized by Weston Brass, PharmD.         Prior Anticoagulation Instructions: INR 3.0  Skip tomorrow's dose of Coumadin then resume same dose of 1/2 tablet every day except 1 tablet on Monday    Current Anticoagulation Instructions: INR-3.5 Hold dose for 04/09/10. tomorrow and resume normal schedule. Take 1 tablet on Monday and 1/2 a tablet on all other days. Patient to increase amount of greens in diet. Recheck in 10 days.

## 2010-12-15 NOTE — Medication Information (Signed)
Summary: rov/tm  Anticoagulant Therapy  Managed by: Bethena Midget, RN, BSN Referring MD: Rollene Rotunda MD PCP: Dr. 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 no    Recent/future dental: no  Any missed doses?: no       Is patient compliant with meds? yes       Allergies: No Known Drug Allergies  Anticoagulation Management History:      The patient is taking warfarin and comes in today for a routine follow up visit.  Positive risk factors for bleeding include an age of 70 years or older.  The bleeding index is 'intermediate risk'.  Negative CHADS2 values include Age > 11 years old.  The start date was 01/18/2006.  His last INR was 1.2.  Anticoagulation responsible provider: Juanda Chance MD, Smitty Cords.  INR POC: 2.0.  Cuvette Lot#: [id number].  Exp: 02/2011.    Anticoagulation Management Assessment/Plan:      The patient's current anticoagulation dose is Coumadin 5 mg tabs: Take as directed by coumadin clinic..  The target INR is 2.0-2.5.  The next INR is due 01/13/2010.  Anticoagulation instructions were given to patient.  Results were reviewed/authorized by Bethena Midget, RN, BSN.  He was notified by Bethena Midget, RN, BSN.         Prior Anticoagulation Instructions: INR 2.2 Continue 1/2 pill everyday except 1pill on Mondays and Fridays. Recheck in 4 weeks.   Current Anticoagulation Instructions: INR 2.0 Continue 2.5mg s everyday except 5mg s on Mondays and Fridays. Recheck in 4 weeks.

## 2010-12-15 NOTE — Medication Information (Signed)
Summary: ccr  Anticoagulant Therapy  Managed by: Bethena Midget, RN, BSN Referring MD: Rollene Rotunda MD PCP: Dr. 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 no    Recent/future dental: no  Any missed doses?: no       Is patient compliant with meds? yes       Allergies: No Known Drug Allergies  Anticoagulation Management History:      The patient is taking warfarin and comes in today for a routine follow up visit.  Positive risk factors for bleeding include an age of 70 years or older.  The bleeding index is 'intermediate risk'.  Negative CHADS2 values include Age > 60 years old.  The start date was 01/18/2006.  His last INR was 1.2.  Anticoagulation responsible provider: Eden Emms MD, Theron Arista.  INR POC: 2.5.  Cuvette Lot#: [id number].  Exp: 06/2011.    Anticoagulation Management Assessment/Plan:      The patient's current anticoagulation dose is Coumadin 5 mg tabs: Take as directed by coumadin clinic..  The target INR is 2.0-2.5.  The next INR is due 05/20/2010.  Anticoagulation instructions were given to patient.  Results were reviewed/authorized by Bethena Midget, RN, BSN.  He was notified by Bethena Midget, RN, BSN.         Prior Anticoagulation Instructions: INR-3.5 Hold dose for 04/09/10. tomorrow and resume normal schedule. Take 1 tablet on Monday and 1/2 a tablet on all other days. Patient to increase amount of greens in diet. Recheck in 10 days.  Current Anticoagulation Instructions: INR 2.5 Continue 2.5mg s everyday except 5mg s on Mondays. Recheck in 3 weeks.

## 2010-12-15 NOTE — Medication Information (Signed)
Summary: rov/tm  Anticoagulant Therapy  Managed by: Bethena Midget, RN, BSN Referring MD: Rollene Rotunda MD PCP: Dr. 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 no    Recent/future dental: no  Any missed doses?: no       Is patient compliant with meds? yes       Allergies: No Known Drug Allergies  Anticoagulation Management History:      The patient is taking warfarin and comes in today for a routine follow up visit.  Positive risk factors for bleeding include an age of 69 years or older.  The bleeding index is 'intermediate risk'.  Negative CHADS2 values include Age > 31 years old.  The start date was 01/18/2006.  His last INR was 1.2.  Anticoagulation responsible Dennison Mcdaid: Allred MD, Fayrene Fearing.  INR POC: 2.6.  Cuvette Lot#: [id number].  Exp: 08/2011.    Anticoagulation Management Assessment/Plan:      The patient's current anticoagulation dose is Coumadin 5 mg tabs: Take as directed by coumadin clinic..  The target INR is 2.0-2.5.  The next INR is due 07/29/2010.  Anticoagulation instructions were given to patient.  Results were reviewed/authorized by Bethena Midget, RN, BSN.  He was notified by Bethena Midget, RN, BSN.         Prior Anticoagulation Instructions: INR 2.7 On Thursday only take 1mg  then resume 2.5mg s everyday except 5mg s on Mondays. Recheck in 2 weeks.  Sample 1mg s Lot # [id number] Exp 09/2010  Current Anticoagulation Instructions: INR 2.6 Today only take 1mg s then resume 2.5mg s everyday except 5mg s on Mondays. Recheck in 3 weeks.   1mg s Lot # [id number] Exp 09/2010 # 1 tablet

## 2010-12-17 NOTE — Medication Information (Signed)
Summary: rov/nb  Anticoagulant Therapy  Managed by: Weston Brass, PharmD Referring MD: Rollene Rotunda MD PCP: Dr. 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 yes       Details: stopped trimethoprim last week  Recent/future dental: no  Any missed doses?: no       Is patient compliant with meds? yes       Allergies: No Known Drug Allergies  Anticoagulation Management History:      The patient is taking warfarin and comes in today for a routine follow up visit.  Positive risk factors for bleeding include an age of 70 years or older.  The bleeding index is 'intermediate risk'.  Negative CHADS2 values include Age > 70 years old.  The start date was 01/18/2006.  His last INR was 1.2.  Anticoagulation responsible provider: Shirlee Latch MD, [place].  INR POC: 2.0.  Cuvette Lot#: [id number].  Exp: 12/2011.    Anticoagulation Management Assessment/Plan:      The patient's current anticoagulation dose is Coumadin 5 mg tabs: Take as directed by coumadin clinic., Warfarin sodium 5 mg tabs: Take as directed.  The target INR is 2.0-2.5.  The next INR is due 12/02/2010.  Anticoagulation instructions were given to patient.  Results were reviewed/authorized by Weston Brass, PharmD.  He was notified by Weston Brass PharmD.         Prior Anticoagulation Instructions: INR 2.2 Continue previous dose of 0.5 tablet everyday Recheck INR in 3 weeks unless trimethoprim changes  Current Anticoagulation Instructions: INR 2.0  Continue same dose of 1/2 tablet every day.  Recheck INR in 4 weeks.

## 2010-12-17 NOTE — Medication Information (Signed)
Summary: rov/sp  Anticoagulant Therapy  Managed by: Weston Brass, PharmD Referring MD: Rollene Rotunda MD PCP: Dr. 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 no    Recent/future dental: no  Any missed doses?: no       Is patient compliant with meds? yes       Allergies: No Known Drug Allergies  Anticoagulation Management History:      The patient is taking warfarin and comes in today for a routine follow up visit.  Positive risk factors for bleeding include an age of 70 years or older.  The bleeding index is 'intermediate risk'.  Negative CHADS2 values include Age > 78 years old.  The start date was 01/18/2006.  His last INR was 1.2.  Anticoagulation responsible provider: Graciela Husbands MD, Viviann Spare.  INR POC: 1.4.  Cuvette Lot#: [id number].  Exp: 11/2011.    Anticoagulation Management Assessment/Plan:      The patient's current anticoagulation dose is Coumadin 5 mg tabs: Take as directed by coumadin clinic., Warfarin sodium 5 mg tabs: Take as directed.  The target INR is 2.0-2.5.  The next INR is due 12/23/2010.  Anticoagulation instructions were given to patient.  Results were reviewed/authorized by Weston Brass, PharmD.  He was notified by Stephannie Peters, PharmD Candidate .         Prior Anticoagulation Instructions: INR 2.0  Continue same dose of 1/2 tablet every day.  Recheck INR in 4 weeks.   Current Anticoagulation Instructions: INR 1.4  Couamdin 5mg  tablets - Take one tablet today, then take 1/2 tablet every day except 1 tablet on Wednesdays

## 2010-12-23 ENCOUNTER — Encounter (INDEPENDENT_AMBULATORY_CARE_PROVIDER_SITE_OTHER): Payer: Medicare Other

## 2010-12-23 ENCOUNTER — Encounter: Payer: Self-pay | Admitting: Internal Medicine

## 2010-12-23 DIAGNOSIS — I4891 Unspecified atrial fibrillation: Secondary | ICD-10-CM

## 2010-12-23 DIAGNOSIS — Z7901 Long term (current) use of anticoagulants: Secondary | ICD-10-CM

## 2010-12-23 LAB — CONVERTED CEMR LAB: POC INR: 1.7

## 2010-12-31 NOTE — Medication Information (Signed)
Summary: Coumadin Clinic  Anticoagulant Therapy  Managed by: Georgina Pillion, PharmD Referring MD: Rollene Rotunda MD PCP: Dr. 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 no    Recent/future dental: no  Any missed doses?: no       Is patient compliant with meds? yes       Allergies: No Known Drug Allergies  Anticoagulation Management History:      Positive risk factors for bleeding include an age of 30 years or older.  The bleeding index is 'intermediate risk'.  Negative CHADS2 values include Age > 58 years old.  The start date was 01/18/2006.  His last INR was 1.2.  Anticoagulation responsible provider: Graciela Husbands MD, Viviann Spare.  INR POC: 1.7.  Cuvette Lot#: [id number].  Exp: 12/2011.    Anticoagulation Management Assessment/Plan:      The patient's current anticoagulation dose is Coumadin 5 mg tabs: Take as directed by coumadin clinic., Warfarin sodium 5 mg tabs: Take as directed.  The target INR is 2.0-2.5.  The next INR is due 01/06/2011.  Anticoagulation instructions were given to patient.  Results were reviewed/authorized by Georgina Pillion, PharmD.  He was notified by Georgina Pillion PharmD.         Prior Anticoagulation Instructions: INR 1.4  Couamdin 5mg  tablets - Take one tablet today, then take 1/2 tablet every day except 1 tablet on Wednesdays   Current Anticoagulation Instructions: Take an extra 1/2 tablet tonight to equal 1 1/2 tablets (7.5 mg) then change regimen to take 1/2 tablet (2.5 mg) daily except for 1 tablet (5 mg) on Wednesdays and Saturdays.  INR 1.7

## 2011-01-06 ENCOUNTER — Encounter (INDEPENDENT_AMBULATORY_CARE_PROVIDER_SITE_OTHER): Payer: Medicare Other

## 2011-01-06 ENCOUNTER — Encounter: Payer: Self-pay | Admitting: Cardiology

## 2011-01-06 DIAGNOSIS — I4891 Unspecified atrial fibrillation: Secondary | ICD-10-CM

## 2011-01-06 DIAGNOSIS — Z7901 Long term (current) use of anticoagulants: Secondary | ICD-10-CM

## 2011-01-06 DIAGNOSIS — I80299 Phlebitis and thrombophlebitis of other deep vessels of unspecified lower extremity: Secondary | ICD-10-CM

## 2011-01-06 LAB — CONVERTED CEMR LAB: POC INR: 3

## 2011-01-12 NOTE — Medication Information (Signed)
Summary: rov/sp  Anticoagulant Therapy  Managed by: Windell Hummingbird, RN Referring MD: Rollene Rotunda MD PCP: Dr. 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 no    Recent/future dental: no  Any missed doses?: no       Is patient compliant with meds? yes       Allergies: No Known Drug Allergies  Anticoagulation Management History:      The patient is taking warfarin and comes in today for a routine follow up visit.  Positive risk factors for bleeding include an age of 70 years or older.  The bleeding index is 'intermediate risk'.  Negative CHADS2 values include Age > 70 years old.  The start date was 01/18/2006.  His last INR was 1.2.  Anticoagulation responsible provider: Shirlee Latch MD, Delmas Faucett.  INR POC: 3.0.  Cuvette Lot#: [id number].  Exp: 11/2011.    Anticoagulation Management Assessment/Plan:      The patient's current anticoagulation dose is Coumadin 5 mg tabs: Take as directed by coumadin clinic., Warfarin sodium 5 mg tabs: Take as directed.  The target INR is 2.0-2.5.  The next INR is due 01/20/2011.  Anticoagulation instructions were given to patient.  Results were reviewed/authorized by Windell Hummingbird, RN.  He was notified by Windell Hummingbird, RN.         Prior Anticoagulation Instructions: Take an extra 1/2 tablet tonight to equal 1 1/2 tablets (7.5 mg) then change regimen to take 1/2 tablet (2.5 mg) daily except for 1 tablet (5 mg) on Wednesdays and Saturdays.  INR 1.7  Current Anticoagulation Instructions: INR 3.0 Take 1/2 tablet today.  Then resume taking 1/2 tablet every day, except take 1 tablet on Wednesdays and  Saturdays. Eat a couple of extra servings of leafy green veggies each week. Recheck in 2 weeks.

## 2011-01-25 ENCOUNTER — Encounter: Payer: Self-pay | Admitting: Cardiology

## 2011-01-25 ENCOUNTER — Encounter (INDEPENDENT_AMBULATORY_CARE_PROVIDER_SITE_OTHER): Payer: Medicare Other

## 2011-01-25 DIAGNOSIS — I2699 Other pulmonary embolism without acute cor pulmonale: Secondary | ICD-10-CM

## 2011-01-25 DIAGNOSIS — Z7901 Long term (current) use of anticoagulants: Secondary | ICD-10-CM

## 2011-01-25 DIAGNOSIS — I4891 Unspecified atrial fibrillation: Secondary | ICD-10-CM

## 2011-01-25 LAB — CONVERTED CEMR LAB: POC INR: 3.2

## 2011-02-02 NOTE — Medication Information (Signed)
Summary: rov/pc coumanin ck/mt  Anticoagulant Therapy  Managed by: Windell Hummingbird, RN Referring MD: Rollene Rotunda MD PCP: Dr. 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 no    Recent/future dental: no  Any missed doses?: no       Is patient compliant with meds? yes       Allergies: No Known Drug Allergies  Anticoagulation Management History:      The patient is taking warfarin and comes in today for a routine follow up visit.  Positive risk factors for bleeding include an age of 70 years or older.  The bleeding index is 'intermediate risk'.  Negative CHADS2 values include Age > 27 years old.  The start date was 01/18/2006.  His last INR was 1.2.  Anticoagulation responsible provider: Antoine Poche MD, Fayrene Fearing.  INR POC: 3.2.  Cuvette Lot#: [id number].  Exp: 01/2012.    Anticoagulation Management Assessment/Plan:      The patient's current anticoagulation dose is Coumadin 5 mg tabs: Take as directed by coumadin clinic., Warfarin sodium 5 mg tabs: Take as directed.  The target INR is 2.0-2.5.  The next INR is due 02/08/2011.  Anticoagulation instructions were given to patient.  Results were reviewed/authorized by Windell Hummingbird, RN.  He was notified by Windell Hummingbird, RN.         Prior Anticoagulation Instructions: INR 3.0 Take 1/2 tablet today.  Then resume taking 1/2 tablet every day, except take 1 tablet on Wednesdays and Saturdays. Eat a couple of extra servings of leafy green veggies each week. Recheck in 2 weeks.  Current Anticoagulation Instructions: INR 3.2 Skip coumadin today and tomorrow.  Then begin taking 1/2 tablet  every day, except take 1 tablet on Wednesdays. Recheck in 2 weeks.

## 2011-02-03 ENCOUNTER — Other Ambulatory Visit: Payer: Self-pay | Admitting: Cardiology

## 2011-02-03 ENCOUNTER — Other Ambulatory Visit: Payer: Self-pay | Admitting: *Deleted

## 2011-02-03 MED ORDER — WARFARIN SODIUM 5 MG PO TABS: 5.0000 mg | ORAL_TABLET | ORAL | Status: DC

## 2011-02-08 ENCOUNTER — Encounter: Payer: Medicare Other | Admitting: *Deleted

## 2011-02-10 ENCOUNTER — Ambulatory Visit (INDEPENDENT_AMBULATORY_CARE_PROVIDER_SITE_OTHER): Payer: Medicare Other | Admitting: *Deleted

## 2011-02-10 DIAGNOSIS — I2699 Other pulmonary embolism without acute cor pulmonale: Secondary | ICD-10-CM

## 2011-02-10 DIAGNOSIS — Z7901 Long term (current) use of anticoagulants: Secondary | ICD-10-CM | POA: Insufficient documentation

## 2011-02-10 DIAGNOSIS — I4891 Unspecified atrial fibrillation: Secondary | ICD-10-CM

## 2011-02-10 NOTE — Patient Instructions (Signed)
INR 2.0 Continue taking 1/2 tablet (2.5mg ) daily, except take 1 tablet (5mg ) on Wednesdays. Recheck in 4 weeks.

## 2011-03-01 LAB — APTT
aPTT: 29 seconds (ref 24–37)
aPTT: 30 seconds (ref 24–37)
aPTT: 30 seconds (ref 24–37)
aPTT: 31 seconds (ref 24–37)
aPTT: 31 seconds (ref 24–37)
aPTT: 32 seconds (ref 24–37)

## 2011-03-01 LAB — PROTIME-INR
INR: 1.1 (ref 0.00–1.49)
INR: 1.1 (ref 0.00–1.49)
INR: 1.1 (ref 0.00–1.49)
INR: 1.1 (ref 0.00–1.49)
INR: 1.1 (ref 0.00–1.49)
INR: 1.1 (ref 0.00–1.49)
INR: 1.3 (ref 0.00–1.49)
Prothrombin Time: 14.4 seconds (ref 11.6–15.2)
Prothrombin Time: 14.7 seconds (ref 11.6–15.2)
Prothrombin Time: 14.7 seconds (ref 11.6–15.2)
Prothrombin Time: 15 seconds (ref 11.6–15.2)
Prothrombin Time: 15.9 seconds — ABNORMAL HIGH (ref 11.6–15.2)
Prothrombin Time: 16.4 seconds — ABNORMAL HIGH (ref 11.6–15.2)
Prothrombin Time: 28.3 seconds — ABNORMAL HIGH (ref 11.6–15.2)

## 2011-03-01 LAB — PTH, INTACT AND CALCIUM: Calcium, Total (PTH): 8 mg/dL — ABNORMAL LOW (ref 8.4–10.5)

## 2011-03-01 LAB — BASIC METABOLIC PANEL
BUN: 58 mg/dL — ABNORMAL HIGH (ref 6–23)
BUN: 71 mg/dL — ABNORMAL HIGH (ref 6–23)
BUN: 73 mg/dL — ABNORMAL HIGH (ref 6–23)
BUN: 76 mg/dL — ABNORMAL HIGH (ref 6–23)
BUN: 79 mg/dL — ABNORMAL HIGH (ref 6–23)
CO2: 15 mEq/L — ABNORMAL LOW (ref 19–32)
Calcium: 8.5 mg/dL (ref 8.4–10.5)
Calcium: 8.6 mg/dL (ref 8.4–10.5)
Calcium: 9.3 mg/dL (ref 8.4–10.5)
Chloride: 107 mEq/L (ref 96–112)
Creatinine, Ser: 6.13 mg/dL — ABNORMAL HIGH (ref 0.4–1.5)
Creatinine, Ser: 6.24 mg/dL — ABNORMAL HIGH (ref 0.4–1.5)
Creatinine, Ser: 6.34 mg/dL — ABNORMAL HIGH (ref 0.4–1.5)
GFR calc non Af Amer: 9 mL/min — ABNORMAL LOW (ref >60)
GFR calc non Af Amer: 9 mL/min — ABNORMAL LOW (ref >60)
GFR calc non Af Amer: 9 mL/min — ABNORMAL LOW (ref >60)
GFR calc non Af Amer: 9 mL/min — ABNORMAL LOW (ref >60)
Glucose, Bld: 100 mg/dL — ABNORMAL HIGH (ref 70–99)
Glucose, Bld: 107 mg/dL — ABNORMAL HIGH (ref 70–99)
Glucose, Bld: 123 mg/dL — ABNORMAL HIGH (ref 70–99)
Glucose, Bld: 291 mg/dL — ABNORMAL HIGH (ref 70–99)
Glucose, Bld: 95 mg/dL (ref 70–99)
Potassium: 4.5 mEq/L (ref 3.5–5.1)
Potassium: 4.6 mEq/L (ref 3.5–5.1)
Sodium: 132 mEq/L — ABNORMAL LOW (ref 135–145)

## 2011-03-01 LAB — COMPREHENSIVE METABOLIC PANEL
ALT: 25 U/L (ref 0–53)
AST: 13 U/L (ref 0–37)
AST: 13 U/L (ref 0–37)
Albumin: 2.4 g/dL — ABNORMAL LOW (ref 3.5–5.2)
Albumin: 2.8 g/dL — ABNORMAL LOW (ref 3.5–5.2)
Albumin: 2.8 g/dL — ABNORMAL LOW (ref 3.5–5.2)
BUN: 55 mg/dL — ABNORMAL HIGH (ref 6–23)
BUN: 61 mg/dL — ABNORMAL HIGH (ref 6–23)
CO2: 16 mEq/L — ABNORMAL LOW (ref 19–32)
CO2: 24 mEq/L (ref 19–32)
CO2: 28 mEq/L (ref 19–32)
Calcium: 8.1 mg/dL — ABNORMAL LOW (ref 8.4–10.5)
Calcium: 8.3 mg/dL — ABNORMAL LOW (ref 8.4–10.5)
Calcium: 8.8 mg/dL (ref 8.4–10.5)
Calcium: 9.1 mg/dL (ref 8.4–10.5)
Chloride: 102 mEq/L (ref 96–112)
Creatinine, Ser: 5.66 mg/dL — ABNORMAL HIGH (ref 0.4–1.5)
Creatinine, Ser: 5.95 mg/dL — ABNORMAL HIGH (ref 0.4–1.5)
Creatinine, Ser: 6.57 mg/dL — ABNORMAL HIGH (ref 0.4–1.5)
GFR calc Af Amer: 11 mL/min — ABNORMAL LOW (ref >60)
GFR calc Af Amer: 12 mL/min — ABNORMAL LOW (ref >60)
GFR calc Af Amer: 12 mL/min — ABNORMAL LOW (ref >60)
GFR calc non Af Amer: 10 mL/min — ABNORMAL LOW (ref >60)
GFR calc non Af Amer: 10 mL/min — ABNORMAL LOW (ref >60)
GFR calc non Af Amer: 9 mL/min — ABNORMAL LOW (ref >60)
Glucose, Bld: 145 mg/dL — ABNORMAL HIGH (ref 70–99)
Potassium: 5.4 mEq/L — ABNORMAL HIGH (ref 3.5–5.1)
Sodium: 136 mEq/L (ref 135–145)
Sodium: 141 mEq/L (ref 135–145)
Total Bilirubin: 1.3 mg/dL — ABNORMAL HIGH (ref 0.3–1.2)
Total Protein: 5.5 g/dL — ABNORMAL LOW (ref 6.0–8.3)
Total Protein: 6.3 g/dL (ref 6.0–8.3)
Total Protein: 6.4 g/dL (ref 6.0–8.3)

## 2011-03-01 LAB — URINALYSIS, ROUTINE W REFLEX MICROSCOPIC
Nitrite: NEGATIVE
Specific Gravity, Urine: 1.015 (ref 1.005–1.030)
Urobilinogen, UA: 0.2 mg/dL (ref 0.0–1.0)
pH: 6 (ref 5.0–8.0)

## 2011-03-01 LAB — PREPARE FRESH FROZEN PLASMA

## 2011-03-01 LAB — CBC
HCT: 26.9 % — ABNORMAL LOW (ref 39.0–52.0)
HCT: 27.6 % — ABNORMAL LOW (ref 39.0–52.0)
HCT: 27.9 % — ABNORMAL LOW (ref 39.0–52.0)
HCT: 28.3 % — ABNORMAL LOW (ref 39.0–52.0)
HCT: 31 % — ABNORMAL LOW (ref 39.0–52.0)
HCT: 34.1 % — ABNORMAL LOW (ref 39.0–52.0)
Hemoglobin: 12.9 g/dL — ABNORMAL LOW (ref 13.0–17.0)
Hemoglobin: 9.6 g/dL — ABNORMAL LOW (ref 13.0–17.0)
Hemoglobin: 9.8 g/dL — ABNORMAL LOW (ref 13.0–17.0)
Hemoglobin: 9.8 g/dL — ABNORMAL LOW (ref 13.0–17.0)
MCHC: 33.9 g/dL (ref 30.0–36.0)
MCHC: 34 g/dL (ref 30.0–36.0)
MCV: 91.3 fL (ref 78.0–100.0)
MCV: 91.8 fL (ref 78.0–100.0)
MCV: 91.9 fL (ref 78.0–100.0)
MCV: 92.1 fL (ref 78.0–100.0)
MCV: 92.7 fL (ref 78.0–100.0)
MCV: 92.8 fL (ref 78.0–100.0)
MCV: 93.6 fL (ref 78.0–100.0)
Platelets: 111 10*3/uL — ABNORMAL LOW (ref 150–400)
Platelets: 129 10*3/uL — ABNORMAL LOW (ref 150–400)
Platelets: 172 10*3/uL (ref 150–400)
Platelets: 203 10*3/uL (ref 150–400)
Platelets: 225 10*3/uL (ref 150–400)
Platelets: 233 10*3/uL (ref 150–400)
Platelets: 268 10*3/uL (ref 150–400)
RBC: 3.08 MIL/uL — ABNORMAL LOW (ref 4.22–5.81)
RBC: 3.08 MIL/uL — ABNORMAL LOW (ref 4.22–5.81)
RBC: 3.14 MIL/uL — ABNORMAL LOW (ref 4.22–5.81)
RBC: 3.27 MIL/uL — ABNORMAL LOW (ref 4.22–5.81)
RDW: 14.1 % (ref 11.5–15.5)
RDW: 14.2 % (ref 11.5–15.5)
RDW: 14.3 % (ref 11.5–15.5)
RDW: 14.3 % (ref 11.5–15.5)
RDW: 14.5 % (ref 11.5–15.5)
RDW: 14.7 % (ref 11.5–15.5)
RDW: 14.8 % (ref 11.5–15.5)
RDW: 14.9 % (ref 11.5–15.5)
WBC: 10.8 10*3/uL — ABNORMAL HIGH (ref 4.0–10.5)
WBC: 10.9 10*3/uL — ABNORMAL HIGH (ref 4.0–10.5)
WBC: 7.4 10*3/uL (ref 4.0–10.5)
WBC: 7.7 10*3/uL (ref 4.0–10.5)
WBC: 8.8 10*3/uL (ref 4.0–10.5)
WBC: 9.7 10*3/uL (ref 4.0–10.5)

## 2011-03-01 LAB — RENAL FUNCTION PANEL
Albumin: 2.3 g/dL — ABNORMAL LOW (ref 3.5–5.2)
BUN: 51 mg/dL — ABNORMAL HIGH (ref 6–23)
CO2: 18 mEq/L — ABNORMAL LOW (ref 19–32)
CO2: 20 mEq/L (ref 19–32)
CO2: 27 mEq/L (ref 19–32)
Calcium: 8.8 mg/dL (ref 8.4–10.5)
Calcium: 9.3 mg/dL (ref 8.4–10.5)
Chloride: 106 mEq/L (ref 96–112)
Chloride: 111 mEq/L (ref 96–112)
Chloride: 111 mEq/L (ref 96–112)
Creatinine, Ser: 5.82 mg/dL — ABNORMAL HIGH (ref 0.4–1.5)
Creatinine, Ser: 6.1 mg/dL — ABNORMAL HIGH (ref 0.4–1.5)
GFR calc Af Amer: 11 mL/min — ABNORMAL LOW (ref >60)
GFR calc Af Amer: 11 mL/min — ABNORMAL LOW (ref >60)
GFR calc Af Amer: 12 mL/min — ABNORMAL LOW (ref >60)
GFR calc Af Amer: 12 mL/min — ABNORMAL LOW (ref >60)
GFR calc non Af Amer: 10 mL/min — ABNORMAL LOW (ref >60)
GFR calc non Af Amer: 9 mL/min — ABNORMAL LOW (ref >60)
GFR calc non Af Amer: 9 mL/min — ABNORMAL LOW (ref >60)
Phosphorus: 4 mg/dL (ref 2.3–4.6)
Phosphorus: 4.7 mg/dL — ABNORMAL HIGH (ref 2.3–4.6)
Potassium: 4.7 mEq/L (ref 3.5–5.1)
Potassium: 5.5 mEq/L — ABNORMAL HIGH (ref 3.5–5.1)
Sodium: 140 mEq/L (ref 135–145)
Sodium: 141 mEq/L (ref 135–145)

## 2011-03-01 LAB — CROSSMATCH
ABO/RH(D): A POS
Antibody Screen: NEGATIVE

## 2011-03-01 LAB — GLUCOSE, CAPILLARY
Glucose-Capillary: 102 mg/dL — ABNORMAL HIGH (ref 70–99)
Glucose-Capillary: 107 mg/dL — ABNORMAL HIGH (ref 70–99)
Glucose-Capillary: 113 mg/dL — ABNORMAL HIGH (ref 70–99)
Glucose-Capillary: 119 mg/dL — ABNORMAL HIGH (ref 70–99)
Glucose-Capillary: 121 mg/dL — ABNORMAL HIGH (ref 70–99)
Glucose-Capillary: 130 mg/dL — ABNORMAL HIGH (ref 70–99)
Glucose-Capillary: 138 mg/dL — ABNORMAL HIGH (ref 70–99)
Glucose-Capillary: 84 mg/dL (ref 70–99)

## 2011-03-01 LAB — TROPONIN I: Troponin I: 0.01 ng/mL (ref 0.00–0.06)

## 2011-03-01 LAB — IRON AND TIBC
Saturation Ratios: 32 % (ref 20–55)
TIBC: 207 ug/dL — ABNORMAL LOW (ref 215–435)
UIBC: 141 ug/dL

## 2011-03-01 LAB — HEMOGLOBIN A1C
Hgb A1c MFr Bld: 5.1 % (ref 4.6–6.1)
Mean Plasma Glucose: 100 mg/dL

## 2011-03-01 LAB — DIFFERENTIAL
Basophils Absolute: 0 10*3/uL (ref 0.0–0.1)
Basophils Absolute: 0 10*3/uL (ref 0.0–0.1)
Eosinophils Absolute: 0.1 10*3/uL (ref 0.0–0.7)
Eosinophils Relative: 1 % (ref 0–5)
Lymphocytes Relative: 18 % (ref 12–46)
Neutro Abs: 13.5 10*3/uL — ABNORMAL HIGH (ref 1.7–7.7)
Neutrophils Relative %: 77 % (ref 43–77)

## 2011-03-01 LAB — CK TOTAL AND CKMB (NOT AT ARMC)
CK, MB: 1 ng/mL (ref 0.3–4.0)
CK, MB: 1.1 ng/mL (ref 0.3–4.0)
Relative Index: INVALID (ref 0.0–2.5)
Relative Index: INVALID (ref 0.0–2.5)
Total CK: 20 U/L (ref 7–232)
Total CK: 26 U/L (ref 7–232)

## 2011-03-01 LAB — URINE MICROSCOPIC-ADD ON

## 2011-03-01 LAB — HEMOGLOBIN AND HEMATOCRIT, BLOOD
HCT: 30.1 % — ABNORMAL LOW (ref 39.0–52.0)
Hemoglobin: 10.1 g/dL — ABNORMAL LOW (ref 13.0–17.0)
Hemoglobin: 7.9 g/dL — CL (ref 13.0–17.0)
Hemoglobin: 9.6 g/dL — ABNORMAL LOW (ref 13.0–17.0)

## 2011-03-01 LAB — PHOSPHORUS: Phosphorus: 4.4 mg/dL (ref 2.3–4.6)

## 2011-03-01 LAB — MAGNESIUM: Magnesium: 2.1 mg/dL (ref 1.5–2.5)

## 2011-03-01 LAB — LIPID PANEL
LDL Cholesterol: 94 mg/dL (ref 0–99)
Total CHOL/HDL Ratio: 5.2 RATIO
VLDL: 20 mg/dL (ref 0–40)

## 2011-03-01 LAB — TSH: TSH: 0.579 u[IU]/mL (ref 0.350–4.500)

## 2011-03-02 LAB — CBC
HCT: 27.9 % — ABNORMAL LOW (ref 39.0–52.0)
Hemoglobin: 9.5 g/dL — ABNORMAL LOW (ref 13.0–17.0)
MCV: 91.3 fL (ref 78.0–100.0)
Platelets: 230 10*3/uL (ref 150–400)
Platelets: 289 10*3/uL (ref 150–400)
RBC: 2.92 MIL/uL — ABNORMAL LOW (ref 4.22–5.81)
RBC: 3.06 MIL/uL — ABNORMAL LOW (ref 4.22–5.81)
RDW: 14.4 % (ref 11.5–15.5)
WBC: 10.3 10*3/uL (ref 4.0–10.5)
WBC: 9.2 10*3/uL (ref 4.0–10.5)
WBC: 9.5 10*3/uL (ref 4.0–10.5)

## 2011-03-02 LAB — BASIC METABOLIC PANEL
Calcium: 9.2 mg/dL (ref 8.4–10.5)
GFR calc Af Amer: 11 mL/min — ABNORMAL LOW (ref >60)
GFR calc non Af Amer: 9 mL/min — ABNORMAL LOW (ref >60)
Glucose, Bld: 97 mg/dL (ref 70–99)
Potassium: 4.6 mEq/L (ref 3.5–5.1)
Sodium: 138 mEq/L (ref 135–145)

## 2011-03-02 LAB — PROTIME-INR
INR: 1.2 (ref 0.00–1.49)
INR: 1.3 (ref 0.00–1.49)
INR: 1.3 (ref 0.00–1.49)
Prothrombin Time: 15.1 seconds (ref 11.6–15.2)
Prothrombin Time: 16.1 seconds — ABNORMAL HIGH (ref 11.6–15.2)
Prothrombin Time: 16.5 seconds — ABNORMAL HIGH (ref 11.6–15.2)

## 2011-03-10 ENCOUNTER — Ambulatory Visit (INDEPENDENT_AMBULATORY_CARE_PROVIDER_SITE_OTHER): Payer: Medicare Other | Admitting: *Deleted

## 2011-03-10 DIAGNOSIS — I2699 Other pulmonary embolism without acute cor pulmonale: Secondary | ICD-10-CM

## 2011-03-10 DIAGNOSIS — I4891 Unspecified atrial fibrillation: Secondary | ICD-10-CM

## 2011-03-10 LAB — POCT INR: INR: 2.9

## 2011-03-30 NOTE — Discharge Summary (Signed)
[name], [name]                ACCOUNT NO.:  [id number]   MEDICAL RECORD NO.:  [id number]          PATIENT TYPE:  INP   LOCATION:  4732                         FACILITY:  [facility]   PHYSICIAN:  Monte Fantasia, MD  DATE OF BIRTH:  [birth year]   DATE OF ADMISSION:  12/10/2008  DATE OF DISCHARGE:  12/18/2008                               DISCHARGE SUMMARY   ADDENDUM:  Please refer to the prior discharge summary dictated by Dr.  Brien Few.  This is an addendum to the discharge summary dictated by Dr. Brien Few  on December 17, 2008.   DISCHARGE DIAGNOSES:  1. Dehydration, orthostasis.  2. Acute renal insufficiency on chronic kidney disease secondary to      dehydration.  3. Chronic renal failure, that is chronic kidney disease stage IV.  4. Atrial fibrillation.  5. Hypertension.  6. Recurrent venous thromboembolic disease, left vein thrombosis,      pulmonary embolism, status post inferior vena cava filter on      December 10, 2008, previous history of right lower extremity deep      venous thrombosis in March 2009.  7. History of retroperitoneal left perinephric hematoma, January 2010      secondary to Coumadin induced coagulopathy.  8. Left heel pain secondary to Achilles tendinitis and tendon      deconditioning.   DISCHARGE MEDICATIONS:  1. Calcitriol 0.25 mcg p.o. daily.  2. Diltiazem CD 240 mg p.o. daily.  3. Toprol-XL 75 mg p.o. daily.  4. Nepro with Carb Vanilla Liquid 237 p.o. t.i.d.  5. MiraLax 17 grams p.o. daily.  6. Senna 1 tablet p.o. at bedtime.  7. Sodium bicarbonate 1 tablet 650 mg p.o. b.i.d.  8. Warfarin 4 mg p.o. daily.   RADIOLOGICAL IMAGES DONE DURING THE HOSPITAL STAY:  Dictation done on  December 18, 2008.  1. CT of the abdomen:  Asymmetric left-sided perinephric hematoma,      unchanged in size, no new findings.  2. CT of the pelvis:  No acute CT findings.   DISCHARGE LABORATORY DATA:  Today on December 18, 2008:  Total WBC 9.2,  hemoglobin 9.6, hematocrit 28.8,  and platelets of 289.  PT of 16.5 and  INR is 1.3.  Sodium 138, potassium 4.6, chloride 107, bicarb 16, glucose  97, BUN 74, creatinine 6.1, and calcium 9.2.   HOSPITAL COURSE:  This an addendum to the prior dictation/discharge  summary.  Over the last 24 hours, the patient has had no new complaints.  The patient was awaiting the repeat CT scan of the abdomen and pelvis  for the perinephric hematoma which is essentially stable.  The patient  at present is asymptomatic and denies any complaints.  The patient at  present is stable to be discharged and can be discharged.  The patient  has a scheduled appointment with [facility] on Friday,  December 20, 2008 for the Coumadin appointment for repeat INR and  adjusting the Coumadin dose.  The patient has been cleared with  Cardiology as stable and can be stable to be discharged.   FOLLOWUP INSTRUCTIONS:  As per  dictated in the prior discharge summary,  the patient is recommended to follow up with Dr. Lewayne Bunting, his  primary cardiologist, Dr. Rollene Rotunda on the date scheduled and also  with Dr. Briant Cedar, his nephrologist.  The patient also has a Coumadin  Clinic appointment on Friday, December 20, 2008 at 10:15 for a PT/INR  check.   PHYSICAL EXAMINATION:  VITAL SIGNS:  Today temperature 97.4, pulse of  98, respirations 16, blood pressure 128/81, and oxygen saturations 100%  on room air.  HEENT:  Pupils equal, reacting to light.  NECK:  Supple.  No pallor, no lymphadenopathy.  RESPIRATORY:  Air entry is bilaterally equal.  No rales, no rhonchi.  CARDIOVASCULAR:  S1-S2 irregular.  ABDOMEN:  Soft.  No tenderness, no guarding, no rigidity.  EXTREMITIES:  Trace edema plus.   DISPOSITION:  The patient is stable to be discharged home and as  recommended to follow up with his nephrologist, primary care physician,  cardiologist, and to follow up with [facility] for the  PT/INR check on Friday, December 20, 2008 at  10:15.      Monte Fantasia, MD  Electronically Signed     MP/MEDQ  D:  12/18/2008  T:  12/19/2008  Job:  [id number]

## 2011-03-30 NOTE — Consult Note (Signed)
NAMEETHRIDGE, SOLLENBERGER                ACCOUNT NO.:  [id number]   MEDICAL RECORD NO.:  [id number]          PATIENT TYPE:  INP   LOCATION:  2304                         FACILITY:  [facility]   PHYSICIAN:  Gerrit Friends. Dietrich Pates, MD, FACCDATE OF BIRTH:  [birth year]   DATE OF CONSULTATION:  12/01/2008  DATE OF DISCHARGE:                                 CONSULTATION   REFERRING PHYSICIAN:  Fayrene Fearing L. Deterding, MD   PRIMARY CARDIOLOGIST:  Doylene Canning. Ladona Ridgel, MD   PRIMARY CARE PHYSICIAN:  Dyke Maes, MD   HISTORY OF PRESENT ILLNESS:  A 70 year old gentleman with a 3-year  history of constant atrial fibrillation for which he is maintained on  anticoagulation and admitted to hospital with a retroperitoneal bleed of  renal origin, hypotension, and progressive anemia.  Mr. Brownlow was first  seen some years ago for paroxysmal supraventricular tachycardia.  This  was thought to be due to a concealed retrograde bypass tract and was  managed with medical therapy.  He subsequently developed paroxysmal and  then constant atrial fibrillation after his final attempted  cardioversion was unsuccessful.  He has been maintained on  anticoagulation.  Heart rate has been controlled with diltiazem 300 mg  daily and metoprolol 50 mg daily.  He typically has no cardiopulmonary  symptoms; specifically, he reports no palpitations, chest discomfort nor  dyspnea.  He developed the sudden onset of severe flank pain, prompting  evaluation in the emergency department where a retroperitoneal  hemorrhage was diagnosed.  Hemoglobin, initially normal, has now dropped  below 8.  Anticoagulation has been reversed with 4 units of fresh frozen  plasma and vitamin K.  Blood transfusion is anticipated in the near  future.   He has had a progressive increase in heart rate as he has been kept  n.p.o. and has not received his usual medications.  He developed some  hypotension last night with blood pressures in the 80s and 90s  systolic,  particularly when he was treated with narcotics for pain relief.  When  he experienced significant pain, blood pressure increased.  Since  transfusion of fresh frozen plasma, blood pressure has been well above  110 systolic.  Initial laboratory testing is notable for a bicarbonate  level of 15, normal electrolytes, and a fairly good lipid profile except  for an HDL level of 27.  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  He is currently receiving a  bicarbonate infusion.   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  He developed DVT and then a pulmonary  embolism in 2007.  He has had coronary angiography in the past with no  significant coronary disease identified.  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  He has a history of iron deficiency  anemia and hypertension.   SOCIAL HISTORY:  Married and lives locally; retired from work with the  [employer].  Remote history of cigarette smoking without  apparent sequelae.  No excessive use of alcohol.  FAMILY HISTORY:  Mother is alive and well; father died due to neoplastic  disease.  No prominent family history of coronary disease or  arrhythmias.   CURRENT MEDICATIONS:  1. Calcitriol.  2. Clonidine.  3. Diltiazem 300 mg daily.  4. Warfarin 2.5 mg daily.  5. Furosemide every other day.  6. Metoprolol 50 mg daily.   ALLERGIES:  He has no known drug allergies.   REVIEW OF SYSTEMS:  He requires corrective lenses for near and far  vision.  He occasionally notes mild pedal edema, particularly in the  right leg.  He has occasional constipation and follows a regular diet.  All other systems reviewed and are negative.   PHYSICAL EXAMINATION:  GENERAL:  Pleasant, fairly comfortable gentleman  in no acute distress.  VITAL SIGNS:  The heart rate is 150 and irregular, respirations 18,  blood  pressure 125/60, and O2 saturation 98% on room air.  HEENT:  EOMs full; normal lids and conjunctivae; normal oral mucosa.  NECK:  No jugular venous distention; normal carotid upstrokes without  bruits.  ENDOCRINE:  No thyromegaly.  HEMATOPOIETIC:  No adenopathy.  LUNGS:  Clear.  CARDIAC:  Irregular rhythm; normal first and second heart sounds; modest  systolic ejection murmur.  Normal PMI.  ABDOMEN:  Distended; firm and nontender; normal bowel sounds.  EXTREMITIES:  1+ edema on the right; chronic stasis changes on the  right; decreased distal pulses on the right.  NEUROLOGIC:  Symmetric strength and tone; normal cranial nerves.   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   LABORATORY DATA:  Notable for an INR of 3.1 on admit for which 4 units  of fresh frozen plasma and 10 mg of vitamin K have been administered.  A1c level was 5.3.  TSH was 0.57.   IMPRESSION:  Control of the patient's ventricular response and atrial  fibrillation has been lost due to his inability to take oral medication  and physiologic stress.  With hypotension, therapeutic options were  limited; however, blood pressure appears to have recovered.  We will  load him with digoxin since that will provide some benefit in heart rate  control without any adverse effects on blood pressure.  We will try  intravenous diltiazem.  If that is not effective, beta-blocker can be  utilized in addition or can be substituted.  If blood pressure does not  tolerate these drugs, we will try amiodarone.   He does not have a very high thromboembolic risk related to his atrial  fibrillation.  In any case, anticoagulation is obviously out of the  question  at present.  A week or two or even 1 month off warfarin does  not present an unacceptable risk.   We greatly appreciate the request for consultation and will be happy to  follow this gentleman with you.      Gerrit Friends. Dietrich Pates, MD, [facility]  Electronically Signed     RMR/MEDQ  D:  12/01/2008  T:  12/02/2008  Job:  [id number]

## 2011-03-30 NOTE — Assessment & Plan Note (Signed)
[name]                            CARDIOLOGY OFFICE NOTE   [name], [name]                         MRN:          [id number]  DATE:01/13/2009                            DOB:          1941-07-03    PRIMARY CARE PHYSICIAN:  Dyke Maes, MD   REASON FOR PRESENTATION:  Evaluate patient with atrial fibrillation,  recent DVT and retroperitoneal bleed.   HISTORY OF PRESENT ILLNESS:  The patient presents for followup after his  recent hospitalizations.  He has had a difficult year so far.  He first  had bleeding from a renal cyst.  This was on therapeutic Coumadin.  He  had to have this Coumadin stopped for this in early January 2010.  He  subsequently presented with a left lower extremity deep venous  thrombosis that necessitated that we gently reinstitute the Coumadin  therapy.  He did have an IVC filter placed as well.  He was in atrial  fibrillation throughout at this time.  The rate was somewhat difficult  to control because of pain both from the bleed and the venous  thromboembolic event.  Since that time, the patient has been doing  better.  He is having much less leg discomfort.  He has not had any  further back pain.  He is not really noticing any palpitations.  He has  had no presyncope or syncope.  He has continued to have his renal  function followed as he does have significant renal insufficiency.  He  remains on the meds as listed.  He has had no new shortness of breath,  PND, or orthopnea.   PAST MEDICAL HISTORY:  1. Pulmonary embolism.  2. Deep venous thrombosis.  3. Retroperitoneal left perinephric bleed.  4. Hypertension.  5. Persistent atrial fibrillation.  6. Acute-on-chronic renal insufficiency with long-standing, stage IV      kidney disease, inferior vena cava filter placement on November 20, 2008.  7. Wolff-Parkinson-White.  8. Minimal coronary artery disease (catheterization 2001 with 25%      plaque in the  coronary artery).  9. Bladder cancer 2001, status post resection.  10.Iron deficiency anemia.   ALLERGIES/INTOLERANCES:  None.   MEDICATIONS:  1. Coumadin.  2. Diltiazem 300 mg daily.  3. Stool softener.  4. Laxative.  5. Toprol 200 mg daily.   REVIEW OF SYSTEMS:  As stated in the HPI and otherwise negative for all  other systems.   PHYSICAL EXAMINATION:  GENERAL:  The patient is in no distress.  VITAL SIGNS:  Blood pressure 104/74, heart rate 81 and regular, weight  194 pounds, and body mass index 29.  HEENT:  Eyes unremarkable.  Pupils equal, round, and reactive to light.  Fundi not visualized.  Oral mucosa unremarkable.  NECK:  No jugular venous distention at 45 degrees.  Carotid upstroke  brisk and symmetric.  No bruits.  No thyromegaly.  LYMPHATICS:  No cervical, axillary, or inguinal adenopathy.  LUNGS:  Clear to auscultation bilaterally.  BACK:  No costovertebral angle tenderness.  CHEST:  Unremarkable.  HEART:  PMI not displaced or sustained.  S1 and S2 within normal limits.  No S3, no S4.  No clicks, no rubs, no murmurs.  ABDOMEN:  Flat, positive bowel sounds, normal in frequency and pitch.  No bruits, no rebound, no guarding.  No midline pulsatile mass.  No  hepatomegaly.  No splenomegaly.  SKIN:  No rashes.  No nodules.  EXTREMITIES:  Pulses 2+.  No edema.   EKG, atrial fibrillation, rate 81, right axis deviation, incomplete  right bundle-branch block, no acute ST-T wave changes.   ASSESSMENT AND PLAN:  1. Atrial fibrillation.  The patient remains on atrial fibrillation      and really does not feel this.  At this point, he is tolerating the      Coumadin.  We will try to keep this at a low therapeutic dose      though this will be somewhat difficult.  At this point, no further      therapy is planned.  2. Deep venous thrombosis.  The patient remains on Coumadin as above.  3. Retroperitoneal bleed.  We understand the risk that this could      recur but he is  high risk without Coumadin and so we will continue      this.  4. Chronic renal insufficiency followed by Dr. Briant Cedar.  5. Hypertension.  Blood pressure is controlled.  We will continue the      meds as listed.  6. Followup.  I will see him back in several weeks.  At some point, I      will reapply a Holter monitor to make sure he has adequate rate      control as it was somewhat difficult to control in the hospital.     Rollene Rotunda, MD, [facility]  Electronically Signed    JH/MedQ  DD: 01/13/2009  DT: 01/14/2009  Job #: [id number]   cc:   Dyke Maes, M.D.

## 2011-03-30 NOTE — Consult Note (Signed)
NAME:  [name], [name]                ACCOUNT NO.:  [id number]   MEDICAL RECORD NO.:  [id number]          PATIENT TYPE:  EMS   LOCATION:  MAJO                         FACILITY:  [facility]   PHYSICIAN:  Sigmund I. Patsi Sears, M.D.DATE OF BIRTH:  1940-12-26   DATE OF CONSULTATION:  12/01/2008  DATE OF DISCHARGE:                                 CONSULTATION   SUBJECTIVE:  [name] is a 70 year old married white male from  [place], who noted the onset of left flank pain, radiating to his  left lower quadrant approximately 7 hours ago.  The patient developed  nausea, but no vomiting.  No fever, no chills.  The patient was seen in  [facility] Emergency Room for sever flank pain, with CT stone protocol  showing a 12 cm complex left retroperitoneal mass.  The patient  previously had MRI in 2005 per Nephrology, showing bilateral large renal  cortical cysts.  The patient is followed every 2 months by Dr. Briant Cedar  in Nephrology.  The past urologic history is significant for 2001, Dr.  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   PAST MEDICAL HISTORY:  1. End-stage renal disease followed by Dr. Briant Cedar.  He has a      history of temporary peritoneal dialysis.  2. Hypertension.  3. Cardiac disease with Wolff-Parkinson-White syndrome.   ALLERGIES:  None known.   Tobacco none.  Alcohol none.   MEDICATIONS:  1. Calcitriol.  2. Clonidine.  3. Diltiazem.  4. Metoprolol.   LABORATORY DATA:  Hemoglobin 12.9, white blood cell count 17,500.  Sodium 132, potassium 3.9, chloride 107, CO2 of 15, creatinine 6.13, and  glucose 291.   PHYSICAL EXAMINATION:  VITAL SIGNS:  Blood pressure 93/71, pulse of 99,  and temperature 96.2.  NECK:  Supple, nontender.  CHEST:  Clear to P and A.  ABDOMEN:  Obese, decrease in bowel sounds.  There is left flank pain,  left upper quadrant, left lower quadrant pain to light palpation.  There  is no rebound, tenderness.  GENITOURINARY:  Testicles descended bilaterally.  The penis is normal  and uncircumcised.  The glans was normal.  Scrotum was normal.  RECTAL:  Normal sphincter tone.  Prostate 3+, lobular, benign, no masses  and blood.  EXTREMITIES:  No cyanosis or edema.  PSYCHOLOGIC:  Normal orientation to time, person, and place.   ASSESSMENT:  CT scan shows large bleeding, retroperitoneal hematoma  secondary to renal cyst disease.  The patient appeared stable at this  time, but we will admit for serial hemoglobin.  We will discuss with  Radiology for possible arterial embolization.      Sigmund I. Patsi Sears, M.D.  Electronically Signed     SIT/MEDQ  D:  12/01/2008  T:  12/01/2008  Job:  [id number]   cc:   Dyke Maes, M.D.  Courtney Paris, M.D.

## 2011-03-30 NOTE — H&P (Signed)
NAME:  [name], [name] NO.:  [id number]   MEDICAL RECORD NO.:  [id number]          PATIENT TYPE:  EMS   LOCATION:  MAJO                         FACILITY:  [facility]   PHYSICIAN:  Vania Rea, M.D. DATE OF BIRTH:  [birth year]   DATE OF ADMISSION:  12/10/2008  DATE OF DISCHARGE:                              HISTORY & PHYSICAL   PRIMARY CARE PHYSICIAN:  Unassigned.   NEPHROLOGIST:  Dyke Maes, M.D.   UROLOGISTLynelle Smoke I. Patsi Sears, M.D.   CARDIOLOGIST:  Doylene Canning. Ladona Ridgel, MD   CHIEF COMPLAINT:  Weakness and dizziness with standing.   HISTORY OF PRESENT ILLNESS:  This is a 70 year-old Caucasian gentleman  with a history of atrial fibrillation, past history of DVT and pulmonary  embolism who was discharged from this facility five days ago after  treatment from massive left retroperitoneal bleed while on Coumadin.  The patient was discharged home with rate control and her medications  but for the past two days had been feeling very weak and having  dizziness when stands.  The patient is currently on both diltiazem 60 mg  every 6 hours as well as Toprol 50 mg every 12 hours.  The patient has  also noted swelling of the left lower extremity for the past two days  and on arrival to the hospital, the patient had lower extremity Doppler  which describes extensive deep venous thrombosis.  The hospitalist  service was called to assist with management.   The patient has had no frank syncope.  He has had no chest pain or  difficulty breathing.  He denies palpitations.   PAST MEDICAL HISTORY:  1. Atrial fibrillation.  2. 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  3. Hypertension.  4. History of right lower extremity DVT with PE in March of 2007.  5. History of massive left retroperitoneal bleed one week ago while on      therapeutic dose of Coumadin.   MEDICATIONS:  1. Cardizem 60 mg every 6 hours.  2. Lopressor 50 mg  every 12 hours.  3. Calcitriol 0.25 mcg daily.  4. Aranesp 200 mcg subcutaneously every Tuesday.  5. Senokot S one tablet at bedtime.  6. Aluminum hydroxide suspension 15 mL p.o. p.r.n.   ALLERGIES:  No known drug allergies.   SOCIAL HISTORY:  The patient denies tobacco, alcohol or illicit drug  use.  He is a retired Scientist, research (life sciences).   FAMILY HISTORY:  No family history of kidney disease, bleeding disorder  or thrombophilia.  No heart disease or diabetes.   REVIEW OF SYSTEMS:  Other than noted above, a ten point review of  systems is unremarkable.   PHYSICAL EXAMINATION:  A somewhat apprehensive 70 year-old Caucasian gentleman lying in a stretcher not acutely distressed otherwise.  VITAL SIGNS:  Temperature 97.5, pulse is 112, respiratory rate 20.  Blood pressure 122/84; it was previously recorded as 119/77 lying and  87/59 standing with his heart rate rising 16 points associated with  dizziness.  He has since been getting IV fluid hydration.  His pupils are round and equal.  Moist mucous pink, anicteric.  He has  no cervical lymphadenopathy or thyromegaly.  CHEST:  Clear to auscultation bilateral.  CARDIOVASCULAR:  Irregular irregular rhythm.  ABDOMEN:  Obese, soft and nontender.  EXTREMITIES:  He has edema of the left leg more so than the right, no  distended veins noted.  CENTRAL NERVOUS SYSTEM:  Cranial nerves II through XII grossly intact.  He has no focal neurological deficit.   LABORATORY DATA:  White count 11.9, hemoglobin is 11.4, platelets 233.  Absolute granulocyte count elevated at 9.8, otherwise  unremarkable.  PT  is 15.9, INR 1.2, sodium is 137, potassium 4.5, chloride 107, CO2 18,  glucose 123, BUN 76, creatinine 6.2, calcium 9.9.  Of note, his  discharge hemoglobin was 9.8.  His discharge BUN and creatinine was  54/5.57.   Doppler of left lower extremity reveals extensive obstructive DVT  coursing from the posterior tibial veins, gastrocnemius veins into  the  popliteal femoral to the distal common femoral vein.  The profunda  femoral vein appears patent.   ASSESSMENT:  1. Acute left lower extremity deep venous thrombosis.  2. Orthostatic hypotension.  3. Dehydration as evidenced by rise in hemoglobin and hematocrit and      rise in BUN and creatinine ratio with orthostasis.  4. Stage IV kidney disease.  5. Atrial fibrillation borderline rate control.   PLAN:  1. Because of this patient's history of catastrophic bleeding with      Coumadin, will defer anticoagulation at this time and call      radiologist for emergent placement of IVC filter.  2. Orthostasis could possibly be related to pulmonary embolism however      he is having no other signs of pulmonary embolism and he is      dehydration.  Will continue with hydration and will give digoxin      for rate control and reduce the dose of his Lopressor.  3. The patient can in all likelihood get a VQ scan  in the a.m.  4. Will consult nephrologist for assistance with management since in      all likelihood he will be getting contrast for      placement for IVC filter.  5. I have discussed implications of nontreatments of this DVT with      this patient.  6. Other plans as per orders.      Vania Rea, M.D.  Electronically Signed     LC/MEDQ  D:  12/10/2008  T:  12/10/2008  Job:  [id number]   cc:   Dyke Maes, M.D.  Doylene Canning. Ladona Ridgel, MD  Sigmund I. Patsi Sears, M.D.

## 2011-03-30 NOTE — Discharge Summary (Signed)
NAMEADVIK, [name]                ACCOUNT NO.:  [id number]   MEDICAL RECORD NO.:  [id number]          PATIENT TYPE:  INP   LOCATION:  6703                         FACILITY:  [facility]   PHYSICIAN:  Beckey Rutter, MD  DATE OF BIRTH:  1941-05-25   DATE OF ADMISSION:  11/30/2008  DATE OF DISCHARGE:  12/05/2008                               DISCHARGE SUMMARY   PRIMARY NEPHROLOGIST:  Dyke Maes, MD   CHIEF COMPLAINT:  Left flank pain.   HOSPITAL PROCEDURES:  1. CT abdomen without contrast on December 01, 2008, impression is:      a.     Inferior extension of perinephric hematoma into the false       Belfus  on the left.      b.     No evidence of ureteral calculus or hydronephrosis.  2. On December 01, 2008,  the patient had CT abdomen which is showing:      a.     Large acute perinephric hematoma on the left, largely       obscure in left kidney.  This hemorrhage likely arises from the       left kidney.  Specific etiology was not demonstrated by this       examination.  Follow up is warranted.      b.     No evidence of urinary tract calculus or hydronephrosis.      c.     Grossly stable appearance of the right kidney with multiple       complex cysts.  3. Chest x-ray on December 01, 2008, impression is minimal atelectasis      at the left lung base.  4. On December 01, 2008, the patient had CT angio with impression      showing no active extravasation is seen.  There has been no change      in the size of the left perinephric, left retroperitoneal hematoma.   HOSPITAL CONSULTATION:  1. The patient had a consultation done on December 01, 2008, by Dr.      Beryle Lathe, Nephrology.  2. Consultation with Cardiology, Dr. Dietrich Pates.   DISCHARGE DIAGNOSES:  1. Acute retroperitoneal bleed with hypovolemic shock.  2. Hypovolemic shock.  3. End-stage renal disease.  The patient has trial of peritoneal      dialysis before.  4. Atrial fibrillation and supraventricular  tachycardia.   DISCHARGE MEDICATIONS:  The patient will be discharged on:  1. Cardizem 60 mg p.o. q.6 h.  2. Lopressor 50 mg p.o. q.12 h.  3. Calcitriol 0.25 mcg p.o. daily.  4. Aranesp 200 mcg subcutaneously every Tuesday.  5. Senokot-S 1 tab p.o. at bedtime.  6. Aluminum hydroxide suspension 15 mL p.o. p.r.n.   HOSPITAL COURSE:  1. As mentioned above, the patient was admitted secondary to      perihepatic hematoma.  The patient was monitored and the bleeding      was felt to be stopped.  His current renal function test is showing      sodium 141, potassium 4.2, chloride 111, bicarb is 20,  glucose 98,      BUN is 54, and creatinine 5.54.  His white blood count is 7.4,      hemoglobin is 9.8, and hematocrit is 29.1 with platelet of 164.   The patient was seen by Nephrology Service during the hospital stay and  venous mapping was entertained, but after discussion with Dr. Briant Cedar,  it was felt the venous mapping can be done as outpatient, and the  patient was stable for discharge.  Hence, the renal function is not  worse than the baseline for the patient.  This information was discussed  with Dr. Kathrene Bongo prior to discharge.   1. AFib/supraventricular tachycardia.  The patient has tachycardia      with activity.  He will be discharged today with Cardizem 60 mg      p.o. q.6 h as well as Lopressor 100 mg p.o. b.i.d.   DISCHARGE PLAN:  The patient is stable for discharge today.  He would  follow up with Dr. Briant Cedar and [facility] Cardiology as discussed with  him.      Beckey Rutter, MD  Electronically Signed     EME/MEDQ  D:  12/05/2008  T:  12/06/2008  Job:  [id number]

## 2011-03-30 NOTE — Consult Note (Signed)
[name], [name]                ACCOUNT NO.:  [id number]   MEDICAL RECORD NO.:  [id number]          PATIENT TYPE:  INP   LOCATION:  2304                         FACILITY:  [facility]   PHYSICIAN:  Fayrene Fearing L. Lynn, M.D.DATE OF BIRTH:  1941/05/02   DATE OF CONSULTATION:  DATE OF DISCHARGE:                                 CONSULTATION   REFERRING PHYSICIAN:  Emergency room physician.   REASON FOR CONSULTATION:  1. Flank hematoma.  2. Chronic kidney disease.   HISTORY OF PRESENT ILLNESS:  This is a 70 year old gentleman with  history of bladder tumor in approximately 2001, history of obstructive  uropathy associated with that.  He also had acute renal failure, and  then he has had a peritoneal catheter inserted but never had to be used.  He has history of hypertension; mild tremor; secondary  hyperparathyroidism; WPW, which apparently has been ablated; history of  atrial fibrillation, and he has been on chronic Coumadin with rate  control.  He has history of PE in 2007.  His kidney disease is advanced,  stage IV-V, creatinine in the 5s.  He had onset of severe flank pain  this evening; radiated around to the front of his abdomen, associated  with queasiness and sweating.  He came into the emergency room because  of the severity of the pain and was found to have a large perirenal  hematoma with a mass in the flank, felt to be a cyst rupture.   MEDICATIONS AT HOME:  Cardizem, he thinks 300 mg;  Toprol-XL, probably  100 mg; Coumadin 2.5 mg per day 6 days a week, 5 mg one day;  calcitriol, he does not know the dose; and question of clonidine and  Lasix in his medication list.  He does not admit to those at this point,  but he is in quite a bit of distress.   PAST MEDICAL HISTORY:  The above illnesses.   ALLERGIES:  No known allergies.   SOCIAL HISTORY:  Nonsmoker and nondrinker.  Married, has 2 children.   FAMILY HISTORY:  Noncontributory at this time.   REVIEW OF SYSTEMS:   HEENT:  Denies headaches, visual difficulties, or  hearing difficulties.  PULMONARY:  No cough or sputum production.  GI:  No nausea, vomiting, or diarrhea prior to this evening.  He does not  have any __________ of hepatitis or yellow jaundice.  GU:  As listed  above.  MUSCULOSKELETAL:  No specific complaints.  SKIN:  Unremarkable.   OBJECTIVE:  GENERAL:  He is in significant distress.  He is pale.  VITAL SIGNS:  Blood pressure is 146/75, temperature 96.2, heart rate in  the 90s.  Pressures varied from 90s systolic up to 180 systolic.  HEENT:  Fundi unremarkable.  Pharynx unremarkable.  NECK:  Without masses or thyromegaly.  LUNGS:  Reveal crackles, left base.  Decreased expansion because of  pain.  Breath sounds normal.  CARDIOVASCULAR:  Irregular.  Grade 2/6 holosystolic murmur heard best at  the lower left sternal border right at the apex.  PMI is 10 cm lateral  to the midsternal line  in the fifth intercostal space.  No significant  edema.  No bruits.  No lifts, heaves, or thrills.  ABDOMEN:  Bowel sounds are much decreased.  Abdomen is distended.  Hard  mass, it is at least 20 cm in diameter in the left flank.  SKIN:  Very pale.  No active lesions.  LYMPHATIC:  No significant adenopathy in the axillae or supraclavicular.  He has posterior cervical adenopathy.  MUSCULOSKELETAL:  Unremarkable.  NEUROLOGICAL:  Did not ask him to do testing.  He moves all extremities.  Deep tendon reflexes are 1+/4+.  Toes are downgoing.  Cranial nerves II  through XII grossly intact.   LABORATORY DATA:  Hemoglobin is 12.9, white count is 17.5, and platelets  225,000.  Sodium 132, potassium 3.9, chloride 107, bicarb 15, creatinine  6.1, BUN 58, and glucose 291.   ASSESSMENT:  1. Left flank pain, presumably cyst rupture.  Anticoagulation is a big      risk __________ that.  Hemodynamically, he is currently stable as      well as his hemoglobin is stable.  Need to follow his hemoglobin      and do  __________ a definitive procedure.  2. Chronic kidney disease, stage IV-V.  His creatinine is increased      over his baseline, and he will probably need dialysis if he has a      definitive  procedure.  He has anemia at the current time but not      uremic.  3. Atrial fibrillation.  We will hold his anticoagulation and rate      control.  4. Wolff-Parkinson-White.  5. Secondary hyperparathyroidism.   PLAN:  1. As above.  We will also review his records.  2. Check his coags, neutralize those.  3. Follow his hemoglobin.  4. Save an arm.  5. Check hepatitis B status.  6. Check parathyroid hormone.  7. Iron/TIBC.           ______________________________  [name], M.D.     JLD/MEDQ  D:  12/01/2008  T:  12/01/2008  Job:  [id number]

## 2011-03-30 NOTE — Consult Note (Signed)
[name], WENBERG                ACCOUNT NO.:  [id number]   MEDICAL RECORD NO.:  [id number]          PATIENT TYPE:  INP   LOCATION:  4732                         FACILITY:  [facility]   PHYSICIAN:  Rollene Rotunda, MD, FACCDATE OF BIRTH:  1941-09-04   DATE OF CONSULTATION:  DATE OF DISCHARGE:                                 CONSULTATION   REQUESTING PHYSICIAN:  Vania Rea, MD   REASON FOR CONSULTATION:  Evaluate the patient with atrial fibrillation  and rapid ventricular rate.   HISTORY OF PRESENT ILLNESS:  The patient is an unfortunate 70 year old  gentleman who was hospitalized on January 17 with back pain.  He was  found to have a perinephric retroperitoneal hematoma and a renal cyst.  He had been on chronic anticoagulation therapy with an INR of 3.1.  This  was for atrial fibrillation.  His Coumadin was stopped during that  hospitalization obviously.  He was managed with rate control alone.  This was somewhat problematic because of his hypertension.  He had to be  sent home on higher doses of beta-blockers in addition to Cardizem.  However, prior to leaving the hospital he was ambulating.  His  ventricular rate was in the 100s.  His blood pressures were stable.  He  went home on Friday.  He was weak, ambulating, and actually got weaker  over Saturday and Sunday.  He was somewhat dizzy on standing.  He did  not have any frank syncope.  He was not really feeling any palpitations  or noticing his heart rate to be particularly fast.  He did not take his  blood pressure at home.  He was not eating well.  He was hydrating.  On  Sunday morning, he got up and had significant left leg pain and  swelling.  This persisted through today.  He came to the emergency room.  In the ER, he has been found to have significant orthostatic hypotension  with a blood pressure going from 122/84 lying to 87/59 standing.  Doppler showed an extensive DVT of his left leg.  He had an emergent  intravenous  inferior vena cava filter placed.  We are now consulted for  rate control of his AFib with rapid ventricular rate.  He was given  digoxin with a total of 0.75 mg IV.  He is currently on 5 mg of  Cardizem, but his rates are still in the 130s.   He has not had any shortness of breath.  He has not had any chest or  neck discomfort.  Again, he is not really noticing palpitations and had  no presyncope or syncope.  He has been fatigued.  He does have left leg  pain and some mild residual left flank pain.  He is in no distress.   PAST MEDICAL HISTORY:  1. Chronic renal insufficiency (the patient reports this is related to      bladder cancer.  He had distant brief hemodialysis, but has not      required that in about 7 years.  He had a peritoneal dialysis      catheter,  but never used this).  2. 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  3. Bladder cancer status post resection.  4. DVT and pulmonary embolism in 2007.  5. Iron-deficiency anemia.  6. Hypertension.  7. Catheterization in the past, but no significant coronary disease.   ALLERGIES/INTOLERANCES:  None.   DISCHARGE MEDICATIONS:  1. Cardizem 60 mg q.6 h.  2. Lopressor 50 mg q.12 h.  3. Calcitriol 0.25 mcg daily.  4. Aranesp 200 mcg every Tuesday.  5. Senokot.  6. Aluminum.   SOCIAL HISTORY:  The patient is married.  He is retired from [employer].  He smoked in distant past.  He does not drink alcohol  routinely.   FAMILY HISTORY:  Noncontributory for early coronary artery disease,  atrial fibrillation or sudden cardiac death.   REVIEW OF SYSTEMS:  As stated in the HPI and negative for all other  systems.   PHYSICAL EXAMINATION:  GENERAL:  The patient is in no acute distress.  VITAL SIGNS:  Heart rate 130s and irregular.  Blood pressure 133/86,  afebrile, respiratory rate 20.  HEENT:  Eyelids unremarkable.  Pupils equal, round, and reactive to  light.  Fundi not visualized.  Oral mucosa  unremarkable.  NECK:  No jugular venous distention at 45 degrees.  Carotid upstroke  brisk and symmetric.  No bruits, no thyromegaly.  LYMPHATICS:  No cervical, axillary, or inguinal adenopathy.  LUNGS:  Clear to auscultation anteriorly without dullness to percussion.  BACK:  No costovertebral angle tenderness.  CHEST:  Unremarkable.  HEART:  PMI not displaced or sustained.  S1 and S2 within normal limits.  No S3, no clicks, no rubs, no murmurs.  ABDOMEN:  Flat.  Positive bowel sounds normal in frequency and pitch, no  bruits, no rebound, no guarding, no midline pulsatile mass, no  organomegaly.  SKIN:  No rashes, no nodules.  EXTREMITIES:  2+ pulses throughout, mild left lower extremity swelling  without edema, no mild tenderness to palpation.  NEUROLOGIC:  Oriented to person, place, and time.  Cranial nerves II  through XII grossly intact, motor grossly intact.   LABORATORY DATA:  Sodium 137, potassium 4.5, BUN 76, creatinine 6.21, PT  1.2.  WBC 11.9, hemoglobin 11.4.  CK-MB and troponin negative.   EKG, atrial fibrillation, rate 80s to 130s, leftward axis, RSR prime in  V1-V2.   ASSESSMENT/PLAN:  1. Deep venous thrombosis.  This is quite problematic.  I had      discussed with the patient the need to re-consider Coumadin in the      future once he was removed from his hematoma as he has risk factors      with his fibrillation to include hypertension, but in particular      because he has also had DVT and pulmonary emboli.  However, this is      going to be an elective return after weighing the risks and      benefits.  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  For now, he has a IVC filter.  Coumadin      or heparin could be safely used.  2. Atrial fibrillation with his leg pain, some residual back pain      along with his orthostasis, rate control will not be  perfect.  I  agree with hydration to improve his orthostasis.  He was given some      digoxin given his renal insufficiency we need to watch this.  Dig      level is ordered.  I will increase his Cardizem to 10 mg an hour.      He probably would tolerate a bolus as needed.  We can restart      p.o.'s with perhaps beta-blockers in the a.m.  3. Renal insufficiency.  His creatinine is slightly elevated, but may      be related to dehydration, which would fit with his other labs and      his orthostasis.  He is being re-hydrated.  4. Orthostatic hypotension as above.  We will reassess these on the      days ahead.      Rollene Rotunda, MD, [facility]  Electronically Signed     JH/MEDQ  D:  12/11/2008  T:  12/11/2008  Job:  [id number]

## 2011-03-30 NOTE — Op Note (Signed)
NAMENADAV, SWINDELL                ACCOUNT NO.:  [id number]   MEDICAL RECORD NO.:  [id number]          PATIENT TYPE:  INP   LOCATION:  2304                         FACILITY:  [facility]   PHYSICIAN:  Sigmund I. Patsi Sears, M.D.DATE OF BIRTH:  1941-10-09   DATE OF PROCEDURE:  12/01/2008  DATE OF DISCHARGE:                               OPERATIVE REPORT   PREOPERATIVE DIAGNOSIS:  Urinary retention.   POSTOPERATIVE DIAGNOSIS:  Urinary retention.   OPERATION:  Flexible cystoscopy, placement of 18-French Council  catheter.   SURGEON:  Sigmund I. Patsi Sears, MD   ANESTHESIA:  Local.   PREPARATION:  With the patient in the supine position, the penis was  prepped and draped in usual fashion.   REVIEW OF HISTORY:  This 70 year old male was admitted overnight with  large retroperitoneal bleed.  He has a history of elevated creatinine  and temporary peritoneal dialysis in the past.  He has been unable to  void today, however and Nephrology service was asked for Foley catheter  placement.  An 18-French catheter is attempted to be placed by the  staff, and by myself, and could not be placed in the bladder because of  prostatic outlet obstruction.  It is noted that the patient has a  history of bladder outlet obstruction in 2000, post surgery by Dr.  Aldean Ast in 2001.   PROCEDURE:  Flexible bedside cysto was accomplished, and no urethral  stricture was identified.  The prostate is 3+ and appeared benign, and  the flexible cystoscope was easily manipulated into the bladder without  difficulty or trauma.  A guidewire was passed into the bladder.  There  was no evidence of bladder stone, tumor, or diverticular formation.  An  18-Council catheter was then passed over the guidewire into the bladder,  and 10 mL of water was placed in the balloon.  The procedure was  terminated at that point.      Sigmund I. Patsi Sears, M.D.  Electronically Signed     SIT/MEDQ  D:  12/01/2008  T:   12/02/2008  Job:  [id number]   cc:   Dr. Darrick Penna

## 2011-03-30 NOTE — Discharge Summary (Signed)
NAMENAVID, LENZEN                ACCOUNT NO.:  [id number]   MEDICAL RECORD NO.:  [id number]          PATIENT TYPE:  INP   LOCATION:  4732                         FACILITY:  [facility]   PHYSICIAN:  Isidor Holts, M.D.  DATE OF BIRTH:  Aug 23, 1941   DATE OF ADMISSION:  12/10/2008  DATE OF DISCHARGE:  12/17/2008                               DISCHARGE SUMMARY   PRIMARY MEDICAL DOCTOR:  Gentry Fitz.   PRIMARY NEPHROLOGIST:  Dr. Dyke Maes.   PRIMARY UROLOGIST:  Dr. Lynelle Smoke I. Tannenbaum.   PRIMARY CARDIOLOGIST:  Dr. Doylene Canning. Ladona Ridgel.   DISCHARGE DIAGNOSES:  1. Dehydration/orthostasis.  2. Acute renal insufficiency on chronic kidney disease, secondary to      #1 above.  3. 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  4. Atrial fibrillation.  5. Hypertension.  6. Recurrent venous thromboembolic disease, ie Left deep vein      thrombosis/pulmonary embolism.  Status post inferior vena caval      filter December 10, 2008.  7. Previous history of right lower extremity deep vein      thrombosis/pulmonary embolism March 2007.  8. History of retroperitoneal/left perinephric hematoma January 2010,      secondary to Coumadin induced coagulopathy.  9. Left heel pain secondary to probable Achilles tendinitis.  10.Deconditioning.   DISCHARGE MEDICATIONS:  These will be listed as an addendum at the  appropriate time, by discharging M.D.   PROCEDURES:  1. Chest x-ray dated December 11, 2008 showed no acute findings.  There      may be minimal left lower lobe scarring and a granuloma in the left      upper lobe.  Lungs are otherwise clear.  2. Ventilation perfusion lung scan done December 11, 2008.  This showed      hyperplasia for pulmonary embolism.  3. IVC filter placement under ultrasound guidance December 10, 2008 by      Dr. Judie Petit. Ruel Favors.  This was an uncomplicated procedure.  4. Abdominal and pelvic CT scan dated December 13, 2008.  This showed      stable left  perinephric hematoma, stable renal cyst, minimal      atelectasis, decreased retroperitoneal hemorrhage into the upper      left pelvis.   CONSULTATIONS:  1. Dr. Rollene Rotunda, cardiologist.  2. Dr. Cecille Aver, nephrologist.   ADMISSION HISTORY:  As in history and physical notes of December 10, 2008, dictated by Dr. Vania Rea.  However, in brief, this is a 70-  year-old male, with known history of atrial fibrillation, stage 4 kidney  disease, hypertension, history of right lower extremity deep vein  thrombosis/pulmonary embolism March 2007, chronic anticoagulation  complicated by retroperitoneal left perinephric bleed/hematoma December 01, 2008, presenting with progressive weakness and dizziness when  standing.  In addition, the patient noted swelling of left lower  extremity.  He therefore, presented to the emergency department, where  left lower extremity venous Doppler demonstrated extensive left lower  extremity deep vein thrombosis.  He was therefore, admitted for further  evaluation, investigation, and management.   CLINICAL COURSE:  1. Venous thromboembolic disease.  For details of presentation, refer      to admission history above.  Left lower extremity venous Doppler      done December 10, 2008, showed evidence of extensive obstructive DVT      from posterior tibial vein, gastrocnemius vein into the popliteal      vein, femoral vein to distal common femoral vein, profunda femoral      vein appeared patent.  No superficial thrombosis or Baker's cyst.      Dr. Judie Petit. Ruel Favors, interventional radiologist, was consulted for      placement of an IVC filter, given the patient's recent history of      large retroperitoneal bleed January 2010, militating against      anticoagulation.  Ultrasound and fluoroscopic guided IVC filter was      placed, in an uncomplicated procedure, on the same date.  Followup      ventilation/perfusion lung scan was carried out on  December 11, 2008, and this was reported as high probability for pulmonary      embolism.  The patient fortunately, did not appear overly short of      breath, and did not have either chest pain or evidence of      hemodynamic instability during the course of his hospitalization.      He was also able to maintain his oxygen saturation at over 90% on      room air.  He does have a known history of atrial fibrillation,      obviously placing him at risk for arterial thrombosis in addition      to his known predilection for venous thrombosis.  It was clear that      the thorny issue of subsequent anticoagulation, would have to be      addressed.   1. Dehydration/orthostasis.  As mentioned in admission history above,      the patient presented with a 2-day history of weakness and      orthostatic symptoms.  Orthostatic blood pressure measurements      confirmed postural hypotension, and initial laboratory studies      showed a BUN of 76, creatinine 6.2, against a baseline creatinine      of 5.7 and BUN of 54 at the time of discharge on December 05, 2008.      This was felt attributable to dehydration and acute renal      insufficiency on baseline chronic renal failure.  He was,      therefore, managed with gentle IV fluids hydration.  Nephrology      consultation was called, which was kindly provided by Dr. Cecille Aver, who was extremely helpful in supervising management      of the patient's fluid status.  Bilateral lower extremity stockings      were also used.  The patient responded to above-mentioned      management measures, and by December 13, 2008, orthostasis had      resolved, and the patient was no longer exhibiting postural      symptoms.  He tolerated PT/OT without deleterious effect, and with      concurrence of nephrologist, IV fluids were discontinued in order      to avoid fluid overload.   1. Chronic renal failure.  The patient has a known history of  chronic      kidney disease stage 4, and BUN was 54  with a creatinine of 5.57 at      the time of discharge on December 05, 2008.  Per nephrology team,      the patient's renal function was at baseline.  However, in the      absence of overt uremia, it was felt that he did not need dialysis      at this time, although clearly that may change in the future.      During the course of the patient's hospitalization, his renal      function has remained stable, albeit at a new baseline, and renal      indices as of December 17, 2008, showed a BUN of 74, creatinine      6.11.   1. Atrial fibrillation.  The patient has a known history of atrial      fibrillation, although Coumadin therapy had been discontinued      during his latest hospitalization, because of bleeding      complications.  At the time of initial evaluation, he was found to      have a rapid ventricular response with a pulse rate of 112.      Cardiology consultation was called, which was kindly provided by      Dr. Rollene Rotunda.  For details of that consultation, refer to      consultation notes of December 11, 2008.  The patient was initially      managed with a single intravenous dose of 0.75 mg Digoxin, and then      was commenced on intravenous infusion of Cardizem at an initial      rate of 5 mg/hour, itrated against heart rate which, periodically,      climbed into the 130s.  Dr. Antoine Poche recommended titrating his      Cardizem to achieve rate control, but subsequently was able to      transition the patient to oral Cardizem in addition to beta      blocker.  Digoxin was not continued, in view of the patient's      significant renal dysfunction.   1. Hypertension.  The patient's blood pressure was adequately      controlled once his orthostasis resolved, with a combination of      Cardizem and beta blocker treatment.  He has remained normotensive      during the course of the rest of his hospitalization.   1. History of  retroperitoneal/left perinephric bleed.  The patient was      hospitalized from December 01, 2008 to December 05, 2008, for      retroperitoneal bleed against a background of Coumadin induced      coagulopathy.  As mentioned above, Coumadin anticoagulation was      therefore discontinued.  During his hospitalization, the patient      had no symptoms of back pain.  His hemoglobin remained stable,      suggestive of interval stability of retroperitoneal hematoma.  This      was however, confirmed with an abdominal pelvic CT scan on December 09, 2008.  For details, refer to procedure list above.  Obviously,      the question of continued anticoagulation, given the patient's high      risk for venous thromboembolic disease and arterial thromboembolic      disease, had to be addressed.  Consensus, following discussion with      subspecialties, including nephrologist and cardiologist, was that  the patient would likely benefit from reinstatement of      anticoagulation, if at all feasible.  Detailed discussions were      held with the patient's family, weighing the pros and cons.  In the      end, it was felt that on balance, the decision would be to restart      anticoagulation gently with close monitoring of the patient's      status and serial imaging studies, with the view of titrating      anticoagulation to target INR of 2-2.5.  The patient and spouse, as      a matter of fact, favored this option.  Coumadin anticoagulation      was therefore, restarted on December 14, 2008.  As of December 17, 2008, INR was still subtherapeutic at 1.3.   1. Left heel pain.  The patient complained of left heel pain, worsened      by weightbearing and attempts at ambulation on December 13, 2008.      Physical examination demonstrated no evidence of tenderness in the      ankle joints, and on flexion.  He was commenced on regular      analgesics.  Physical therapy was encouraged.  Over the next  couple      days, the left heel pain had ameliorated, and we were able to      transition to p.r.n. analgesic medications.  It is likely the      patient did have left Achilles tendinitis.  Unfortunately, NSAIDs      were not feasible because of the patient's anticoagulation and      impaired renal function.   DISPOSITION:  The patient was, on December 17, 2008, clinically stable,  practically asymptomatic, and clearly nearing discharge.  Details of  disposition will be dictated in an addendum at the appropriate time, by  the discharging M.D.  However, the plan is to repeat an abdominopelvic  CT scan without contrast in the a.m. of December 18, 2008, and provided  this shows interval stability and/or diminution of retroperitoneal  hematoma, the patient will be considered for discharge on that date.  He  is somewhat deconditioned secondary to his recent acute problems, and  will benefit from PT/OT as tolerated.   DIET:  Renal: 60-2-2.   ACTIVITY:  As tolerated.  Otherwise, per PT/OT.   FOLLOWUP INSTRUCTIONS:  The patient is to follow up with Dr. Doylene Canning.  Ladona Ridgel, his primary cardiologist, and Dr. Rollene Rotunda, on a date to  be scheduled. He is also to follow up with Dr. Briant Cedar, nephrologist.  He  already has an appointment scheduled for January 28, 2009, telephone  number [phone].  He had, in the recent past, been followed up for his  anticoagulation at the [facility] and he is instructed to  go back to the Coumadin Clinic on December 20, 2008 in the a.m. for  PT/INR check.      Isidor Holts, M.D.  Electronically Signed     CO/MEDQ  D:  12/17/2008  T:  12/17/2008  Job:  [id number]   cc:   Dyke Maes, M.D.  Rollene Rotunda, MD, [facility]  Doylene Canning. Ladona Ridgel, MD

## 2011-03-30 NOTE — Assessment & Plan Note (Signed)
[facility]                            CARDIOLOGY OFFICE NOTE   [name], [name]                         MRN:          [id number]  DATE:02/12/2008                            DOB:          21-Sep-1941    PRIMARY:  Dr. Briant Cedar.   REASON PRESENTATION:  Evaluate the patient with atrial fibrillation.   HISTORY OF PRESENT ILLNESS:  The patient presents for followup of atrial  fibrillation.  We last saw him in March 2005.  Hospitalized and had deep  venous thrombosis.  He is followed by Dr. Briant Cedar.  He has renal  insufficiency.  There was an attempted cardioversion at that time but he  did not maintain sinus rhythm, and he has apparently been in permanent  atrial fibrillation since that time.  He has been on Coumadin.  He does  not feel the atrial fibrillation.  He denies any presyncope or syncope.  He does not have any palpitations.  Does not have any shortness of  breath or chest pain.  He is able to be active.  Denies any chest  pressure, neck or arm discomfort.   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 Lewayne Bunting, minimal coronary artery  disease (Catheterization 2001 with 25% plaque in the right coronary  artery), and bladder cancer 2001 status post resection, iron-deficiency  anemia.   ALLERGIES:  None.   MEDICATIONS:  1. Coumadin per our Coumadin Clinic.  2. Diltiazem 3 mg daily.  3. Iron.  4. Stool softener.  5. Laxative.  6. Calcitriol.  7. Clonidine 0.1 mg q. a.m. nightly.  8. Toprol to mg daily.  9. Furosemide 40 mg daily.   REVIEW OF SYSTEMS:  As stated in HPI otherwise negative for other  systems.   PHYSICAL EXAMINATION:  The patient is in no distress.  Blood pressure 128/96, heart 73 and regular, body mass index 30,  weight  203 pounds.  HEENT:  Eyelids unremarkable.  Pupils are equal, round, and reactive to  light and accommodation.  Fundi are not visualized.  Oral mucosa  unremarkable.  NECK:  No jugular venous distension at 45 degrees, carotid upstroke  brisk and symmetric, no bruits, thyromegaly.  LYMPHATICS:  No cervical, axillary, or inguinal adenopathy.  LUNGS:  Clear to auscultation bilaterally.  BACK:  No costovertebral angle tenderness.  CHEST:  Unremarkable.  HEART:  PMI not displaced or sustained, S1 and S2 within normal limits,  no S3, no murmurs.  ABDOMEN:  Flat, positive bowel sounds, normal in frequency and pitch, no  bruits, rebound, guarding.  No hepatomegaly, splenomegaly.  SKIN:  No rashes, no nodules.  EXTREMITIES:  With 2+ pulses throughout, no edema.    EKG atrial fibrillation, rate 73, right axis deviation, no acute ST-T  wave changes.   ASSESSMENT/PLAN:  1. I have not put a 24-hour monitor on the patient since he has been      in permanent atrial fibrillation.  We need to  do this to make sure      he has reasonable rate control which I would suspect.  I would      suggest he remain on the Coumadin given the pulmonary embolism and      the atrial fibrillation.  He is not having any problems with this.      Monitored in our Coumadin Clinic.  2. Hypertension.  Blood pressure is controlled with medications as      listed.  3. Supraventricular tachycardia.  The patient and parent concealed      Wolff-Parkinson-White.  His managed medically by Dr. Ladona Ridgel and has      had no recurrent symptomatic dysrhythmias.  4. Stage IV kidney disease followed every 2 months by Dr. Briant Cedar      and remains off dialysis.  5. Followup.  Will see him back in 1 year as is required in our      Coumadin Clinic.  He will continue to follow monthly in the      Coumadin Clinic.     Rollene Rotunda, MD, [facility]  Electronically Signed    JH/MedQ  DD: 02/12/2008  DT: 02/12/2008  Job #:  [id number]   cc:   Dyke Maes, M.D.

## 2011-03-30 NOTE — H&P (Signed)
NAME:  [name], [name] NO.:  [id number]   MEDICAL RECORD NO.:  [id number]          PATIENT TYPE:  EMS   LOCATION:  MAJO                         FACILITY:  [facility]   PHYSICIAN:  Vania Rea, M.D. DATE OF BIRTH:  26-Apr-1941   DATE OF ADMISSION:  11/30/2008  DATE OF DISCHARGE:                              HISTORY & PHYSICAL   PRIMARY CARE PHYSICIAN:  Unassigned.   NEPHROLOGIST:  Dyke Maes, M.D.   UROLOGISTLynelle Smoke I. Patsi Sears, M.D.   CHIEF COMPLAINT:  Left flank pain.   HISTORY OF PRESENT ILLNESS:  This is a 70 year old Caucasian gentleman  with a history of paroxysmal atrial fibrillation on chronic Coumadin  anticoagulation who also has stage IV kidney disease with a baseline  creatinine of about 6 and who was at home this evening when he had  sudden onset of left flank pain and just not feeling well. The patient  thought he had a kidney stone and came to the emergency room where a CT  scan was done which showed a large retroperitoneal hematoma.  The  patient has been seen by a nephrologist and urologist and they think he  may have had ruptured renal cyst precipitating the bleed.  Hospitalist  service was called to assist in management.   The patient now just says he is feeling progressively lousy.  Feels a  little dizzy when he sits up.  Left flank pain persists.   PAST MEDICAL HISTORY:  1. Paroxysmal atrial fibrillation due to Wolff-Parkinson-White      syndrome.  2. Stage IV kidney disease.  3. Hypertension.  4. History of right lower extremity DVT with pulmonary embolism in      March 2007.   MEDICATIONS:  The patient cannot remember all his medications and has  not brought them with him but they include calcitriol, clonidine,  diltiazem, Metoprolol, Coumadin, iron complex, and Lasix.   ALLERGIES:  No known drug allergies.   SOCIAL HISTORY:  Denies tobacco, alcohol or illicit drug use. He is a  retired Scientist, research (life sciences).   FAMILY HISTORY:  Denies any family history of bleeding disorder,  clotting disorder, kidney disease, heart disease, or diabetes.   REVIEW OF SYSTEMS:  Other than noted above, the 10-point review of  systems is unremarkable.   PHYSICAL EXAMINATION:  GENERAL:  He is looking pale.  Obese, elderly,  Caucasian gentleman lying on the stretcher.  VITAL SIGNS:  On initial presentation, blood pressure 161/123 with pulse  of 105, respirations 20. Currently blood pressure is 85/50, heart rate  110, respiratory rate 22.  Oxygen saturation 97% on 2 L.  He has  received 2 mg of Dilaudid in 4 mg of Zofran.  Has also received about 2  L of IV normal saline bolus.  HEENT:  Pupils are round, equal. Mucous membranes are pale, anicteric.  NECK:  He cervical lymphadenopathy or thyromegaly.  SKIN:  Sweaty and somewhat clammy.  CHEST:  Clear to auscultation bilaterally.  CARDIOVASCULAR:  Regular rhythm.  ABDOMEN:  Obese, soft, but is tender in the anterior and posterior  flank.  No  masses felt.  EXTREMITIES:  Without edema.  He has 2+ dorsalis pedis pulses  bilaterally.  CENTRAL NERVOUS SYSTEM:  Cranial nerves II-XII grossly intact. He has no  focal neurologic deficits.   LABORATORY DATA:  White count is 17.5, hemoglobin 12.9, platelets  225,000.  He has 77% neutrophils, absolute granulocytes 13.5.  Serum  chemistry is remarkable only for a glucose of 291, BUN 58 and creatinine  6.1.  CT scan of the abdomen and pelvis shows a left paranephric  hematoma extending into the false pelvis.  No evidence of urethral  dilation or urethral calculus.  Bladder and prostate appear normal.  There is no pelvic ascites.  EKG shows irregularly irregular atrial  fibrillation.   ASSESSMENT:  1. Acute retroperitoneal bleed.  2. Hypovolemic shock.  3. Stage IV kidney disease.  4. Atrial fibrillation.   PLAN:  Will get a stat PT/PTT which is pending.  Will presume it is  elevated and go ahead and give this patient 4  units of FFP stat and 2  units of packed red blood cells, since this gentleman has probably lost  a significant amount of volume in an acute period which is not yet  reflected in his hemoglobin.  It is possible the sudden drop in his  pressure is due to the Dilaudid, but I think it is safest at this time  to presume that it is due to acute bleed and transfuse as necessary.  Will also give 10 units of vitamin K intravenously and admit the patient  to the intensive care unit.  Will monitor his hemoglobin every four  hours and if he does not stabilize, or is requiring excessive  transfusions, the patient may eventually need a nephrectomy.   I appreciate the assistance of the nephrologist and urologist in the  management of this patient.   ADDENDUM:  I have explained to the patient and his wife that by  reversing his anticoagulation, he does have an increased stroke risk.      Vania Rea, M.D.  Electronically Signed     LC/MEDQ  D:  12/01/2008  T:  12/01/2008  Job:  [id number]   cc:   Vonzell Schlatter. Patsi Sears, M.D.  Rollene Rotunda, MD, [facility]  Dyke Maes, M.D.  Sanjuana Mae, MD

## 2011-04-02 NOTE — Consult Note (Signed)
[name], [name]                            ACCOUNT NO.:  [id number]   MEDICAL RECORD NO.:  [id number]                   PATIENT TYPE:  INP   LOCATION:  1827                                 FACILITY:  [facility]   PHYSICIAN:  Learta Codding, M.D.                 DATE OF BIRTH:  1941-10-21   DATE OF CONSULTATION:  11/09/2003  DATE OF DISCHARGE:                                   CONSULTATION   EMERGENCY ROOM CONSULTATION:   REFERRING PHYSICIANS:  1. Wilson Singer, M.D.  2. Dyke Maes, M.D.   CARDIOLOGISTS:  Dr. Corinda Gubler and Dr. Ladona Ridgel.   CURRENT COMPLAINT:  Syncope earlier today.   HISTORY OF PRESENT ILLNESS:  Patient is a 70 year old male with a history of  longstanding syncope felt to be vasovagal/neurocardiogenic in origin.  The  patient in the past has been evaluated by Dr. 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  The patient today was admitted to the emergency room after earlier this  morning he walked to the bathroom when suddenly got up and started urinating  he felt that he became dizzy and had a sudden syncopal episode.  The patient  hit his face and had a nose laceration.  He was out for just a few seconds  but was unable to get up again and had no dizziness or weakness.  He tells  me that he has a longstanding history of syncope which is usually associated  with stress, blood draws and other situations where the patient becomes  excited.  He has had significant EP workup including event monitor in the  past and I suspect that he has had documented supraventricular tachycardia.  Today on admission to the emergency room the patient was found to be in  atrial fibrillation with controlled heart rate.  Interestingly most of his  episodes of syncope appear vasovagal in nature however, there are times when  the patient suddenly passes out and has a quick recovery suggesting a  tachycardia.  His  electrocardiogram does not demonstrate any acute ischemic  changes.  At some time later in the emergency room he developed normal sinus  rhythm and was found to have WPW.   ALLERGIES:  No known drug allergies.   MEDICATIONS:  1. Bicitra two tablespoons b.i.d.  2. Cardizem 300 mg p.o. once daily.  3. Lasix 40 mg a day.  4. Calcitrol.  5. Toprol XL 50 mg a day.  6. Lipitor 10 mg a day.  7. Doxazosin 12 mg p.o. once daily.   PAST MEDICAL HISTORY:  1. 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  2. History of bladder tumor resection.  3. History of Wolff-Parkinson-White and supraventricular tachycardia.  4. History of iron-deficiency anemia.  5. History of incidental hepatic mass.   SOCIAL HISTORY:  The patient lives in [place] with his wife.  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  The patient does not smoke.  He does not drink  alcohol.   FAMILY HISTORY:  Mother is alive in her 109s.  Father died in his 29s with a  history of syncope.  Also has siblings that have history of syncope.   REVIEW OF SYSTEMS:  No fever or chills.  Recent URI.  No headache or sore  throat.  No chest pain or shortness of breath.  No frequency or dysuria.  Positive for weakness.  No myalgia or arthralgia.  No nausea or vomiting.   PHYSICAL EXAMINATION:  VITAL SIGNS:  Blood pressure 98/58, heart rate 67  beats per minute, respirations are 18, temperature 97.1.  GENERAL:  Well-nourished white male in no apparent distress.  HEENT:  NCAT.  PERRLA.  EOMI.  Status post suture of nasal bridge.  NECK:  Supple.  No bruit or JVD.  HEART:  Heart regular rate and rhythm, normal sinus, S1 and S2.  LUNGS:  Clear breath sounds bilaterally.  SKIN:  No rash or lesions.  ABDOMEN:  Soft, nontender.  No rebound or guarding.  GU/RECTAL:  Deferred.  EXTREMITIES:  No cyanosis, clubbing, or edema.  NEUROLOGIC:  Patient alert and oriented.  Grossly nonfocal.   CHEST X-RAY:   No cardiomegaly.  No infiltrates.   ELECTROCARDIOGRAM:  Atrial fibrillation now converted to normal sinus  rhythm, atrial fibrillation rate is 84 beats per minute, no acute ischemic  changes.   LABORATORIES:  Sodium 140, potassium 4.4, BUN 51, glucose 139, pH 7.42,  bicarb is 21.1, hemoglobin is 12.9, hematocrit is 38.   IMPRESSION AND PLAN:  1. Syncope.  The patient has a longstanding history of syncope, some of his     symptoms sound vasovagal in nature and certainly has had neurocardiogenic     syncope for a long period of time, he also has documented     supraventricular tachycardia in the setting of Wolff-Parkinson-White.  He     now presents in atrial fibrillation with conducting slow antegrade over     the atrioventricular node.  I have discussed the case with Dr. Ladona Ridgel and     it is felt that the patient is currently not at high risk of conducting     __________ accessory pathway otherwise, he would have a wide QRS     tachycardia and increased heart rate, in essence with beta blocker and     calcium channel blockers __________ evidence that the patient is     conducting antegrade over the accessory pathway therefore is felt to be     at low risk for malignant arrhythmia.  The patient will likely require an     EP study however, he may still need further ablation of his accessory     pathway as he does have documented narrow QRS tachycardia in the past, it     is possible that he could have had a syncope secondary to a narrow QRS     tachycardia and therefore may benefit from ablation.  The case has been     discussed with Dr. Ladona Ridgel who will see the patient back in the office     after the holiday and then will proceed with an EP study.  2. Atrial fibrillation.  Patient has converted to normal sinus rhythm.  He     has no evidence of coronary artery disease, normal left ventricular  function, age is less than 89 years of age, and there is no indication    patient needs  Coumadin at this point in time but final decision can be     made when the patient is seen back in the office.   DISPOSITION AND FOLLOWUP:  Dr. Ladona Ridgel in the next couple of weeks and plan  to proceed with an EP study.                                               Learta Codding, M.D.    GED/MEDQ  D:  11/09/2003  T:  11/09/2003  Job:  [id number]   cc:   Wilson Singer, M.D.  [address]  [place]  [place] [address]  Fax: [phone]   Dyke Maes, M.D.  [address]  [place]  [place] [address]  Fax: [phone]   E. Graceann Congress, M.D.   Doylene Canning. Ladona Ridgel, M.D.

## 2011-04-02 NOTE — Op Note (Signed)
NAMEJETT, FUKUDA                ACCOUNT NO.:  [id number]   MEDICAL RECORD NO.:  [id number]          PATIENT TYPE:  OUT   LOCATION:  ENDO                         FACILITY:  [facility]   PHYSICIAN:  Arvilla Meres, M.D. LHCDATE OF BIRTH:  February 12, 1941   DATE OF PROCEDURE:  01/21/2006  DATE OF DISCHARGE:                                 OPERATIVE REPORT   PROCEDURE:  Cardioversion.   PATIENT IDENTIFICATION:  [name] is a very pleasant 70 year old male with  advanced renal disease, who was admitted with a recent pulmonary embolus.  This was complicated by atrial fibrillation with rapid ventricular response,  which was not responsive to rate control therapies.  Prior to the  cardioversion, we performed TEE, which showed an ejection fraction of about  45%.  There was smoke in the left atrium, but no left atrial appendage clot  and, thus, he was deemed appropriate for attempted cardioversion.   DESCRIPTION OF PROCEDURE:  Sedation was performed under the supervision of  Dr. Ivin Booty in Anesthesiology, and the patient was sedated with 125 mg of  Pentothal.  There was adequate sedation and control of the airway.  We  delivered a biphasic synchronized shock at 200 joules, there was a brief  reversion to sinus rhythm for 1 to 3 beats, and then the patient promptly  reverted to atrial fibrillation.  Cardioversion was then re-attempted with  another 200 joule synchronized shock with a brief reversion to sinus rhythm,  but once again the patient went back to atrial fibrillation.  At this point,  the procedure was abandoned.  There were no apparent complications.      Arvilla Meres, M.D. [facility]  Electronically Signed     DB/MEDQ  D:  01/21/2006  T:  01/21/2006  Job:  [id number]

## 2011-04-02 NOTE — Consult Note (Signed)
[name], [name]                ACCOUNT NO.:  [id number]   MEDICAL RECORD NO.:  [id number]          PATIENT TYPE:  INP   LOCATION:  1404                         [facility]ACILITY:  [facility]   PHYSICIAN:  Rollene Rotunda, M.D.   DATE OF BIRTH:  1941/06/08   DATE OF CONSULTATION:  01/17/2006  DATE OF DISCHARGE:                                   CONSULTATION   CARDIOLOGY CONSULTATION   PRIMARY CARDIOLOGIST:  Doylene Canning. Ladona Ridgel, M.D.   REASON FOR CONSULTATION:  [name] is a 70 year old male, with history of  supraventricular tachycardia, followed by Dr. Lewayne Bunting in the office  with no known history of coronary artery disease, who is now referred for  evaluation of recurrent paroxysmal atrial fibrillation with rapid  ventricular response.   The patient's cardiac history is notable for longstanding history of  recurrent syncope, felt to be essentially neurocardiogenic, but which was  associated with, in one case, paroxysmal atrial fibrillation which  subsequently converted spontaneously to normal sinus rhythm in December of  2004. The patient also reports having undergone extensive cardiac work up in  2001, when he was diagnosed with bladder carcinoma and reports having had a  stress test and a subsequent coronary angiogram which was reportedly normal.  He has not had any subsequent cardiac testing.  The patient also has  reported history of normal ventricular function.   The patient presented to [facility] in follow up from Urgent Care  on [address] where he presented with a three day history of left shoulder  pain and left thoracic pleuritic pain exacerbated by movement and deep  inspiration.  A chest CT scan was recommended, but given his chronic renal  failure, this was changed to a ventilation/perfusion scan.  This yielded a  high probability for pulmonary embolus and he was subsequently placed on  Lovenox overlap and started on Coumadin.   On admission, however, the  patient was also found to be in atrial  fibrillation at 106 beats per minute.  He is now referred for further  evaluation and management.  Of note, however, the patient denies any recent  sensation of tachy palpitations, nor is he aware of the dysrhythmia while  here in the hospital.   ALLERGIES:  No known drug allergies.   HOME MEDICATIONS:  1.  Diltiazem 300 mg daily.  2.  Toprol XL 50 mg p.o. daily.  3.  Lasix 40 mg daily.  4.  Clonidine 0.2 mg daily.  5.  Aspirin 81 mg daily.  6.  Iron supplement daily.  7.  Calcitriol.   PAST MEDICAL HISTORY:  Chronic renal failure, followed by Dr. Primitivo Gauze.  Bladder cancer, 2001, status post resection.  History of iron  deficiency anemia.  History of supraventricular tachycardia felt to be  secondary to concealed Wolf-Parkinson-White syndrome per Dr. Lewayne Bunting.  Patient has hypertension.   SOCIAL HISTORY:  The patient lives here in [place] with his wife.  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  He quit  smoking tobacco 38 years ago.  He denies alcohol use.   FAMILY HISTORY:  Mother is still living and is in her 78's with no known  cardiac history.  Father died from complications of cancer.  Siblings have  no known heart disease.   REVIEW OF SYSTEMS:  Denies any history of exertional chest pain or recent  development of exertional dyspnea, orthopnea, paroxysmal nocturnal dyspnea.  He does have occasional lower extremity pedal edema which is well  controlled.  Occasionally has palpitations but no recent exacerbation.  He  did have some right leg swelling about one month ago or paroxysmal nocturnal  dyspnea. He did report some right leg swelling about one month ago which has  since resolved.  He denies any recent evidence of upper or lower  gastrointestinal bleeding.  He denies any recent fevers, sweats, chills,  productive cough.  He does have occasional constipation which he attributes  to  diltiazem.  Remaining review of systems negative.  Patient also reports  not having had any syncope in over one year.   PHYSICAL EXAMINATION:  VITAL SIGNS:  Blood pressure 109/75.  Temperature  98.2.  Pulse 100, irregular.  Respirations 16.  Oxygen saturation 99% on  room air.  GENERAL APPEARANCE:  70 year old male sitting upright in no apparent  distress.  HEENT:  Normocephalic, atraumatic.  NECK:  Preserved bilateral carotid pulses without bruits.  No jugular venous  distention.  LUNGS:  Bibasilar crackles (left greater than right), but without wheezes.  HEART:  Irregularly, irregular (S1, S2) with no significant murmurs.  ABDOMEN:  Soft, nontender, with intact bowel sounds.  EXTREMITIES:  Palpable bilateral femoral pulses with minimally palpable  posterior tibialis and dorsalis pedis pulses, no pedal edema, no other focal  deficit.   CLINICAL DATA:  Chest x-ray on admission showing left lower lobe air space  disease/mildly elevated left hemidiaphragm, (probable atelectasis); ? early  infectious process.  ? small bowel ileus.  Admission electrocardiogram  showing atrial fibrillation 106 beats per minute with right axis deviation;  nonspecific ST abnormalities.   LABORATORY DATA:  Hemoglobin 13.1, hematocrit 38.7, white blood cell count  9.3, platelet count 170,000.  Sodium 136, potassium 3.6, BUN 65, creatinine  4.9, glucose 127, INR 1.2.  D-dimer 2.30.  Room air ABG's:  PH 7.40, pCO2  32, pO2 69, bicarbonate 20.   IMPRESSION:  1.  Recurrent paroxysmal atrial fibrillation with rapid ventricular      response.  2.  Pulmonary embolus.      1.  High probability ventilation/perfusion scan.  3.  History of supraventricular tachycardia.      1.  Probably secondary to concealed Wolf-Parkinson-White syndrome.  4.  History of normal left ventricular function.  5.  Chronic renal failure.  6.  Hypertension.  PLAN:  Recommendation is to up-titrate current medication regimen for better   rate control with increase of Toprol to 50 mg b.i.d.  Accordingly, we will  cut back on Clonidine to 0.1 b.i.d. so as to not further drop the blood  pressure.  We will also add a TSH level to current blood work to exclude any  underlying thyroid disorder as a possible etiology.   Regarding Coumadin anticoagulation, the patient does report having been on  Coumadin in the past but took himself off this medication secondary to  reported difficulty in maintaining a regular schedule as well as some  associated mild epistaxis.  Never-the-less, at this point,  he has had  recurrent episodes of atrial fibrillation and he is also typically  unaware  that he is out of rhythm and given that he re-presents with recurrent atrial  fibrillation,  we therefore do recommend, at this point, maintaining the  patient on long-term Coumadin anticoagulation.  We will be happy to have him  establish with our Coumadin clinic for subsequent monitoring following his  discharge.      Gene Serpe, P.A. [facility]    ______________________________  Rollene Rotunda, M.D.    GS/MEDQ  D:  01/17/2006  T:  01/17/2006  Job:  [id number]   cc:   Dyke Maes, M.D.  Fax: [phone]

## 2011-04-02 NOTE — Discharge Summary (Signed)
[name], [name]                ACCOUNT NO.:  [id number]   MEDICAL RECORD NO.:  [id number]          PATIENT TYPE:  INP   LOCATION:  4740                         FACILITY:  [facility]   PHYSICIAN:  [name], M.D.  DATE OF BIRTH:  1941/09/11   DATE OF ADMISSION:  01/16/2006  DATE OF DISCHARGE:  01/22/2006                                 DISCHARGE SUMMARY   DISCHARGE DIAGNOSES:  1.  Pulmonary embolism with deep venous thrombosis.  2.  Chronic kidney disease stage IV.  3.  Hypertension.  4.  Recurrent paroxysmal atrial fibrillation with rapid ventricular      response.   PROCEDURES:  1.  January 16, 2006, nuclear medicine ventilation-perfusion lung scan.      Impression:  High probability for pulmonary embolus.  2.  January 19, 2006, lower extremity Dopplers.  Impression:  Positive for      right deep venous thrombosis.  3.  January 21, 2006, attempted cardioversion without success by [name].   CONSULTATION:  [name], M.D.   HISTORY OF PRESENT ILLNESS:  [name] is a 70 year old male with a history  of chronic kidney disease followed by [name] at [facility], also with a history of obstructive uropathy.  The patient  reports that his baseline serum creatinine is approximately 4 mg/dl.  He  presents to the [facility] Emergency Room this evening with a two  day history of left shoulder and left pleuritic chest pain.  He was seen at  his local urgent care center and was sent for a CT scan.  Apparently he did  not get this because his serum creatinine was elevated and was sent here for  further evaluation where a ventilator-perfusion lung scan was read as high  probability for pulmonary embolus.  Patient is now being admitted for  further treatment.  The patient denies any leg pain or leg swelling.   ADMISSION LABORATORY DATA:  A white blood cell count of 12,200, hemoglobin  14.1, platelet count of 168,000.   Potassium 5, calcium 9.6, albumin 3.3,  creatinine 5.3, BUN 66.  INR is 1.1.  Fibrin derivatives 2.3 up to 0.48.   HOSPITAL COURSE:  PROBLEM #1 -  PULMONARY EMBOLISM WITH RIGHT DEEP VENOUS  THROMBOSIS:  Patient was admitted.  Dopplers were performed secondary work-  up.  He received anticoagulant therapy to include heparin initially, then  Lovenox and Coumadin.  PT and INRs were monitored daily by the pharmacist.  Lovenox was discontinued on January 20, 2006, secondary to an INR of 2.7.  Dr.  Antoine Lynn will officially be monitoring patient in the Coumadin clinic.   PROBLEM #2 -  CHRONIC KIDNEY DISEASE STAGE IV:  Certainly patient's  creatinine was monitored throughout this admission.  His appropriate arm was  saved for probability of an access in the near future.  Patient's creatinine  did bump to 5.5 on January 22, 2006, in which his IV Lasix was discontinued  secondary to probable volume depletion.  Otherwise, patient will follow up  with  [name] in the office for follow-up laboratory studies.   PROBLEM #3 -  HYPERTENSION:  Patient's blood pressures were not optimal.  He  was placed on his Toprol with several increases per cardiology.  He was  initially placed on Clonidine which was discontinued on January 18, 2006,  secondary to low blood pressures.  His Lasix was eventually discontinued on  January 20, 2006, secondary to appropriate volumes and blood pressures  systolic in the 130 range.  Patient was admitted on a telemetry bed.  Please  see #4.   PROBLEM #4 -  RECURRENT PAROXYSMAL ATRIAL FIBRILLATION WITH RAPID  VENTRICULAR RESPONSE:  As stated above, patient was placed on a telemetry  bed.  Dr. Antoine Lynn was consulted on January 17, 2006.  He recommended to  titrate the current medications which included the beta-blocker and the  diltiazem for better rate control with appropriate increases in Toprol to 50  mg b.i.d.  He also checked a TSH level which was 0.545 and within normal  range.   A cardioversion was scheduled to be attempted on January 21, 2006,  which was performed without success.  Dr. Antoine Lynn stated that he would be  happy to establish him in the Coumadin clinic for subsequent monitoring  following his discharge.  Patient was discharged on Coumadin.   DISCHARGE INSTRUCTIONS:  Patient was instructed about his potassium and  renal diet given his stage IV kidney disease.  He is to increase his  activity very slowly.  He has medication changes per [name]  which have been thoroughly discussed with him.  We will follow his PT/INRs  in Dr. Jenene Lynn Coumadin clinic.      Angel Fallen, PA      Toccoa C. Lowell Lynn, M.D.  Electronically Signed    MY/MEDQ  D:  03/23/2006  T:  03/24/2006  Job:  [id number]   cc:   [facility]   [name], M.D.  [address]  [address]  [place]  [place] [address]

## 2011-04-02 NOTE — H&P (Signed)
[name], [name]                ACCOUNT NO.:  [id number]   MEDICAL RECORD NO.:  [id number]          PATIENT TYPE:  INP   LOCATION:  1404                         FACILITY:  [facility]   PHYSICIAN:  Alvin C. Lowell Guitar, M.D.  DATE OF BIRTH:  Jul 08, 1941   DATE OF ADMISSION:  01/16/2006  DATE OF DISCHARGE:                                HISTORY & PHYSICAL   [name] is a 70 year old male with a history of chronic kidney disease  followed by Dr. Primitivo Gauze at [facility] a history of  obstructive uropathy.  The patient reports that his baseline serum  creatinine is approximately 4 mg per dL.  He presents to the [facility]  Emergency Room this evening with a 2-day history of left shoulder and left  pleuritic chest pain.  He was seen at his local Urgent Care Center and was  sent for a CT scan.  Apparently, he could not get this because his serum  creatinine was elevated and was sent here for further evaluation where a  ventilation perfusion lung scan was read as high probability for pulmonary  embolus.  The patient is now being admitted for further treatment.  The  patient denies any leg pain or leg swelling.   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  The patient is followed by  Dr. Aldean Ast.  He has been released from his practice.  The tumor was  present in 2001.  The patient also has a history of Wolff-Parkinson-White  syndrome (WPW) with a history of supraventricular tachycardia.  The patient  has a history of a peritoneal dialysis catheter placed in 2001 that was  never used and it was subsequently removed.  He also has a history of  hypertension.   SOCIAL HISTORY:  The patient is not a smoker and does not drink alcoholic  beverages.  He is married.  He and his wife have 2 children, 6 grandchildren  and 1 great-grandchild.  He formerly was a Production designer, theatre/television/film the press room at the  [employer]   ALLERGIES:  NO KNOWN ALLERGIES TO MEDICATIONS.   CURRENT MEDICATIONS:  Diltiazem, Calcitriol, Toprol-XL, clonidine, and  Lasix.  The exact doses are uncertain the current time.   FAMILY HISTORY:  Noncontributory.   REVIEW OF SYSTEMS:  As per HPI.   PHYSICAL EXAMINATION:  VITAL SIGNS:  Blood pressure is 148/106, heart rate  is 106, respiratory rate is 22 and he is afebrile.  GENERAL:  Pleasant, moderately obese Caucasian male.  HEENT:  Head atraumatic and normocephalic.  Extraocular movements are  intact.  NECK:  No masses.  HEART:  Regular rhythm and rate.  LUNGS:  Rales on the left.  ABDOMEN:  Soft.  EXTREMITIES:  No tenderness and no edema.  NEUROLOGIC:  No obvious focality.   LABORATORY STUDIES:  A white blood count of 12,200, hemoglobin 14.1 g and  platelet count 168,000.  Potassium is 5, calcium 9.6, albumin 3.3,  creatinine 5.3 and BUN 66.  INR is 1.1 and fibrin derivatives  2.3 (up to  0.48), pO2 69, pCO2 32 and pH 7.4.   ASSESSMENT:  1.  Acute pulmonary embolus.  2.  Hypertension, suboptimal control.  3.  Chronic kidney disease stage IV.   PLAN:  1.  Anticoagulant therapy.  2.  Blood pressure treatment  3.  ESRD education.      Mindi Slicker. Lowell Guitar, M.D.  Electronically Signed     ACP/MEDQ  D:  01/17/2006  T:  01/17/2006  Job:  [id number]

## 2011-04-07 ENCOUNTER — Ambulatory Visit (INDEPENDENT_AMBULATORY_CARE_PROVIDER_SITE_OTHER): Payer: Medicare Other | Admitting: *Deleted

## 2011-04-07 DIAGNOSIS — I2699 Other pulmonary embolism without acute cor pulmonale: Secondary | ICD-10-CM

## 2011-04-07 DIAGNOSIS — I4891 Unspecified atrial fibrillation: Secondary | ICD-10-CM

## 2011-04-07 LAB — POCT INR: INR: 2.1

## 2011-05-05 ENCOUNTER — Ambulatory Visit (INDEPENDENT_AMBULATORY_CARE_PROVIDER_SITE_OTHER): Payer: Medicare Other | Admitting: *Deleted

## 2011-05-05 DIAGNOSIS — I2699 Other pulmonary embolism without acute cor pulmonale: Secondary | ICD-10-CM

## 2011-05-05 DIAGNOSIS — I4891 Unspecified atrial fibrillation: Secondary | ICD-10-CM

## 2011-06-02 ENCOUNTER — Encounter: Payer: Medicare Other | Admitting: *Deleted

## 2011-06-03 ENCOUNTER — Encounter: Payer: Medicare Other | Admitting: *Deleted

## 2011-06-04 ENCOUNTER — Ambulatory Visit (INDEPENDENT_AMBULATORY_CARE_PROVIDER_SITE_OTHER): Payer: Medicare Other | Admitting: *Deleted

## 2011-06-04 DIAGNOSIS — I4891 Unspecified atrial fibrillation: Secondary | ICD-10-CM

## 2011-06-04 DIAGNOSIS — I2699 Other pulmonary embolism without acute cor pulmonale: Secondary | ICD-10-CM

## 2011-06-25 ENCOUNTER — Ambulatory Visit (INDEPENDENT_AMBULATORY_CARE_PROVIDER_SITE_OTHER): Payer: Medicare Other | Admitting: *Deleted

## 2011-06-25 DIAGNOSIS — I4891 Unspecified atrial fibrillation: Secondary | ICD-10-CM

## 2011-06-25 DIAGNOSIS — I2699 Other pulmonary embolism without acute cor pulmonale: Secondary | ICD-10-CM

## 2011-07-23 ENCOUNTER — Encounter: Payer: Medicare Other | Admitting: *Deleted

## 2011-07-27 ENCOUNTER — Ambulatory Visit (INDEPENDENT_AMBULATORY_CARE_PROVIDER_SITE_OTHER): Payer: Medicare Other | Admitting: *Deleted

## 2011-07-27 DIAGNOSIS — I2699 Other pulmonary embolism without acute cor pulmonale: Secondary | ICD-10-CM

## 2011-07-27 DIAGNOSIS — I4891 Unspecified atrial fibrillation: Secondary | ICD-10-CM

## 2011-07-27 LAB — POCT INR: INR: 2.4

## 2011-08-24 ENCOUNTER — Ambulatory Visit (INDEPENDENT_AMBULATORY_CARE_PROVIDER_SITE_OTHER): Payer: Medicare Other | Admitting: *Deleted

## 2011-08-24 DIAGNOSIS — I2699 Other pulmonary embolism without acute cor pulmonale: Secondary | ICD-10-CM

## 2011-08-24 DIAGNOSIS — I4891 Unspecified atrial fibrillation: Secondary | ICD-10-CM

## 2011-09-20 ENCOUNTER — Other Ambulatory Visit: Payer: Self-pay | Admitting: Cardiology

## 2011-09-21 ENCOUNTER — Ambulatory Visit (INDEPENDENT_AMBULATORY_CARE_PROVIDER_SITE_OTHER): Payer: Medicare Other | Admitting: *Deleted

## 2011-09-21 DIAGNOSIS — Z7901 Long term (current) use of anticoagulants: Secondary | ICD-10-CM

## 2011-09-21 DIAGNOSIS — I4891 Unspecified atrial fibrillation: Secondary | ICD-10-CM

## 2011-09-21 DIAGNOSIS — I2699 Other pulmonary embolism without acute cor pulmonale: Secondary | ICD-10-CM

## 2011-10-19 ENCOUNTER — Ambulatory Visit (INDEPENDENT_AMBULATORY_CARE_PROVIDER_SITE_OTHER): Payer: Medicare Other | Admitting: *Deleted

## 2011-10-19 DIAGNOSIS — Z7901 Long term (current) use of anticoagulants: Secondary | ICD-10-CM

## 2011-10-19 DIAGNOSIS — I4891 Unspecified atrial fibrillation: Secondary | ICD-10-CM

## 2011-10-19 DIAGNOSIS — I2699 Other pulmonary embolism without acute cor pulmonale: Secondary | ICD-10-CM

## 2011-10-19 LAB — POCT INR: INR: 3.6

## 2011-11-02 ENCOUNTER — Ambulatory Visit (INDEPENDENT_AMBULATORY_CARE_PROVIDER_SITE_OTHER): Payer: Medicare Other | Admitting: *Deleted

## 2011-11-02 DIAGNOSIS — I4891 Unspecified atrial fibrillation: Secondary | ICD-10-CM

## 2011-11-02 DIAGNOSIS — I2699 Other pulmonary embolism without acute cor pulmonale: Secondary | ICD-10-CM

## 2011-11-02 DIAGNOSIS — Z7901 Long term (current) use of anticoagulants: Secondary | ICD-10-CM

## 2011-11-02 LAB — POCT INR: INR: 3

## 2011-11-15 ENCOUNTER — Ambulatory Visit (INDEPENDENT_AMBULATORY_CARE_PROVIDER_SITE_OTHER): Payer: Medicare Other

## 2011-11-15 DIAGNOSIS — J159 Unspecified bacterial pneumonia: Secondary | ICD-10-CM

## 2011-11-15 DIAGNOSIS — J9801 Acute bronchospasm: Secondary | ICD-10-CM

## 2011-11-23 ENCOUNTER — Encounter: Payer: Medicare Other | Admitting: *Deleted

## 2011-11-26 ENCOUNTER — Telehealth: Payer: Self-pay | Admitting: Physician Assistant

## 2011-11-26 ENCOUNTER — Ambulatory Visit (INDEPENDENT_AMBULATORY_CARE_PROVIDER_SITE_OTHER): Payer: Medicare Other | Admitting: *Deleted

## 2011-11-26 DIAGNOSIS — I2699 Other pulmonary embolism without acute cor pulmonale: Secondary | ICD-10-CM

## 2011-11-26 DIAGNOSIS — I4891 Unspecified atrial fibrillation: Secondary | ICD-10-CM

## 2011-11-26 LAB — POCT INR: INR: 6.7

## 2011-11-26 LAB — PROTIME-INR
INR: 5.27 (ref <1.50)
Prothrombin Time: 51.2 seconds — ABNORMAL HIGH (ref 11.6–15.2)

## 2011-11-26 NOTE — Telephone Encounter (Signed)
Rec'd a call from [facility] that his INR was 5.27. Called pt. He has not yet taken coumadin today. Current dose is 5mg  tab, 1/2 tab daily except 1 tab on Weds. He was on ABX but has completed them. He is not having any bleeding issues. Advised him to hold the coumadin tonight and then resume his usual dose. Office to call him Monday for recheck appointment. Pt agrees.

## 2011-12-03 ENCOUNTER — Ambulatory Visit (INDEPENDENT_AMBULATORY_CARE_PROVIDER_SITE_OTHER): Payer: Medicare Other | Admitting: *Deleted

## 2011-12-03 DIAGNOSIS — I4891 Unspecified atrial fibrillation: Secondary | ICD-10-CM

## 2011-12-03 DIAGNOSIS — Z7901 Long term (current) use of anticoagulants: Secondary | ICD-10-CM

## 2011-12-03 DIAGNOSIS — I2699 Other pulmonary embolism without acute cor pulmonale: Secondary | ICD-10-CM

## 2011-12-03 NOTE — Patient Instructions (Signed)
Discussed leafy green vegetable intake. Anticoagulant therapy teaching.

## 2011-12-13 ENCOUNTER — Ambulatory Visit (INDEPENDENT_AMBULATORY_CARE_PROVIDER_SITE_OTHER): Payer: Medicare Other | Admitting: *Deleted

## 2011-12-13 DIAGNOSIS — I2699 Other pulmonary embolism without acute cor pulmonale: Secondary | ICD-10-CM

## 2011-12-13 DIAGNOSIS — I4891 Unspecified atrial fibrillation: Secondary | ICD-10-CM

## 2011-12-13 DIAGNOSIS — Z7901 Long term (current) use of anticoagulants: Secondary | ICD-10-CM

## 2011-12-14 ENCOUNTER — Encounter: Payer: Medicare Other | Admitting: *Deleted

## 2011-12-27 ENCOUNTER — Ambulatory Visit (INDEPENDENT_AMBULATORY_CARE_PROVIDER_SITE_OTHER): Payer: Medicare Other | Admitting: Pharmacist

## 2011-12-27 DIAGNOSIS — Z7901 Long term (current) use of anticoagulants: Secondary | ICD-10-CM

## 2011-12-27 DIAGNOSIS — I4891 Unspecified atrial fibrillation: Secondary | ICD-10-CM

## 2011-12-27 DIAGNOSIS — I2699 Other pulmonary embolism without acute cor pulmonale: Secondary | ICD-10-CM

## 2012-01-17 ENCOUNTER — Ambulatory Visit (INDEPENDENT_AMBULATORY_CARE_PROVIDER_SITE_OTHER): Payer: Medicare Other

## 2012-01-17 DIAGNOSIS — Z7901 Long term (current) use of anticoagulants: Secondary | ICD-10-CM

## 2012-01-17 DIAGNOSIS — I4891 Unspecified atrial fibrillation: Secondary | ICD-10-CM

## 2012-01-17 DIAGNOSIS — I2699 Other pulmonary embolism without acute cor pulmonale: Secondary | ICD-10-CM

## 2012-01-17 LAB — POCT INR: INR: 3.5

## 2012-01-25 ENCOUNTER — Other Ambulatory Visit: Payer: Self-pay | Admitting: Cardiology

## 2012-01-31 ENCOUNTER — Ambulatory Visit (INDEPENDENT_AMBULATORY_CARE_PROVIDER_SITE_OTHER): Payer: Medicare Other

## 2012-01-31 DIAGNOSIS — I2699 Other pulmonary embolism without acute cor pulmonale: Secondary | ICD-10-CM

## 2012-01-31 DIAGNOSIS — I4891 Unspecified atrial fibrillation: Secondary | ICD-10-CM

## 2012-01-31 DIAGNOSIS — Z7901 Long term (current) use of anticoagulants: Secondary | ICD-10-CM

## 2012-02-21 ENCOUNTER — Ambulatory Visit (INDEPENDENT_AMBULATORY_CARE_PROVIDER_SITE_OTHER): Payer: Medicare Other

## 2012-02-21 DIAGNOSIS — Z7901 Long term (current) use of anticoagulants: Secondary | ICD-10-CM

## 2012-02-21 DIAGNOSIS — I2699 Other pulmonary embolism without acute cor pulmonale: Secondary | ICD-10-CM

## 2012-02-21 DIAGNOSIS — I4891 Unspecified atrial fibrillation: Secondary | ICD-10-CM

## 2012-03-20 ENCOUNTER — Ambulatory Visit (INDEPENDENT_AMBULATORY_CARE_PROVIDER_SITE_OTHER): Payer: Medicare Other | Admitting: Pharmacist

## 2012-03-20 DIAGNOSIS — Z7901 Long term (current) use of anticoagulants: Secondary | ICD-10-CM

## 2012-03-20 DIAGNOSIS — I4891 Unspecified atrial fibrillation: Secondary | ICD-10-CM

## 2012-03-20 DIAGNOSIS — I2699 Other pulmonary embolism without acute cor pulmonale: Secondary | ICD-10-CM

## 2012-03-20 LAB — POCT INR: INR: 2.3

## 2012-05-11 ENCOUNTER — Ambulatory Visit (INDEPENDENT_AMBULATORY_CARE_PROVIDER_SITE_OTHER): Payer: Medicare Other

## 2012-05-11 DIAGNOSIS — Z7901 Long term (current) use of anticoagulants: Secondary | ICD-10-CM

## 2012-05-11 DIAGNOSIS — I2699 Other pulmonary embolism without acute cor pulmonale: Secondary | ICD-10-CM

## 2012-05-11 DIAGNOSIS — I4891 Unspecified atrial fibrillation: Secondary | ICD-10-CM

## 2012-05-11 LAB — POCT INR: INR: 2.9

## 2012-06-22 ENCOUNTER — Ambulatory Visit (INDEPENDENT_AMBULATORY_CARE_PROVIDER_SITE_OTHER): Payer: Medicare Other | Admitting: Pharmacist

## 2012-06-22 DIAGNOSIS — I2699 Other pulmonary embolism without acute cor pulmonale: Secondary | ICD-10-CM

## 2012-06-22 DIAGNOSIS — I4891 Unspecified atrial fibrillation: Secondary | ICD-10-CM

## 2012-06-22 DIAGNOSIS — Z7901 Long term (current) use of anticoagulants: Secondary | ICD-10-CM

## 2012-08-03 ENCOUNTER — Ambulatory Visit (INDEPENDENT_AMBULATORY_CARE_PROVIDER_SITE_OTHER): Payer: Medicare Other

## 2012-08-03 DIAGNOSIS — Z7901 Long term (current) use of anticoagulants: Secondary | ICD-10-CM

## 2012-08-03 DIAGNOSIS — I4891 Unspecified atrial fibrillation: Secondary | ICD-10-CM

## 2012-08-03 DIAGNOSIS — I2699 Other pulmonary embolism without acute cor pulmonale: Secondary | ICD-10-CM

## 2012-09-07 ENCOUNTER — Other Ambulatory Visit: Payer: Self-pay | Admitting: *Deleted

## 2012-09-07 ENCOUNTER — Ambulatory Visit (INDEPENDENT_AMBULATORY_CARE_PROVIDER_SITE_OTHER): Payer: Medicare Other | Admitting: *Deleted

## 2012-09-07 DIAGNOSIS — I2699 Other pulmonary embolism without acute cor pulmonale: Secondary | ICD-10-CM

## 2012-09-07 DIAGNOSIS — I4891 Unspecified atrial fibrillation: Secondary | ICD-10-CM

## 2012-09-07 DIAGNOSIS — Z7901 Long term (current) use of anticoagulants: Secondary | ICD-10-CM

## 2012-09-07 LAB — POCT INR: INR: 2.3

## 2012-10-14 ENCOUNTER — Other Ambulatory Visit: Payer: Self-pay | Admitting: Cardiology

## 2012-10-20 ENCOUNTER — Ambulatory Visit (INDEPENDENT_AMBULATORY_CARE_PROVIDER_SITE_OTHER): Payer: Medicare Other | Admitting: *Deleted

## 2012-10-20 DIAGNOSIS — I2699 Other pulmonary embolism without acute cor pulmonale: Secondary | ICD-10-CM

## 2012-10-20 DIAGNOSIS — Z7901 Long term (current) use of anticoagulants: Secondary | ICD-10-CM

## 2012-10-20 DIAGNOSIS — I4891 Unspecified atrial fibrillation: Secondary | ICD-10-CM

## 2012-12-01 ENCOUNTER — Ambulatory Visit (INDEPENDENT_AMBULATORY_CARE_PROVIDER_SITE_OTHER): Payer: Medicare Other | Admitting: *Deleted

## 2012-12-01 DIAGNOSIS — Z7901 Long term (current) use of anticoagulants: Secondary | ICD-10-CM

## 2012-12-01 DIAGNOSIS — I2699 Other pulmonary embolism without acute cor pulmonale: Secondary | ICD-10-CM

## 2012-12-01 DIAGNOSIS — I4891 Unspecified atrial fibrillation: Secondary | ICD-10-CM

## 2013-01-04 ENCOUNTER — Ambulatory Visit (INDEPENDENT_AMBULATORY_CARE_PROVIDER_SITE_OTHER): Payer: Medicare Other

## 2013-02-01 ENCOUNTER — Ambulatory Visit (INDEPENDENT_AMBULATORY_CARE_PROVIDER_SITE_OTHER): Payer: Medicare Other | Admitting: *Deleted

## 2013-02-01 DIAGNOSIS — I2699 Other pulmonary embolism without acute cor pulmonale: Secondary | ICD-10-CM

## 2013-02-01 LAB — POCT INR: INR: 2.4

## 2013-03-15 ENCOUNTER — Ambulatory Visit (INDEPENDENT_AMBULATORY_CARE_PROVIDER_SITE_OTHER): Payer: Medicare Other | Admitting: *Deleted

## 2013-03-15 DIAGNOSIS — Z7901 Long term (current) use of anticoagulants: Secondary | ICD-10-CM

## 2013-03-15 DIAGNOSIS — I4891 Unspecified atrial fibrillation: Secondary | ICD-10-CM

## 2013-03-15 DIAGNOSIS — I2699 Other pulmonary embolism without acute cor pulmonale: Secondary | ICD-10-CM

## 2013-03-15 LAB — POCT INR: INR: 2.5

## 2013-04-13 ENCOUNTER — Other Ambulatory Visit: Payer: Self-pay | Admitting: Cardiology

## 2013-04-26 ENCOUNTER — Ambulatory Visit (INDEPENDENT_AMBULATORY_CARE_PROVIDER_SITE_OTHER): Payer: Medicare Other

## 2013-04-26 DIAGNOSIS — I2699 Other pulmonary embolism without acute cor pulmonale: Secondary | ICD-10-CM

## 2013-04-26 DIAGNOSIS — Z7901 Long term (current) use of anticoagulants: Secondary | ICD-10-CM

## 2013-04-26 DIAGNOSIS — I4891 Unspecified atrial fibrillation: Secondary | ICD-10-CM

## 2013-06-07 ENCOUNTER — Ambulatory Visit (INDEPENDENT_AMBULATORY_CARE_PROVIDER_SITE_OTHER): Payer: Medicare Other | Admitting: Pharmacist

## 2013-06-07 DIAGNOSIS — Z7901 Long term (current) use of anticoagulants: Secondary | ICD-10-CM

## 2013-06-07 DIAGNOSIS — I4891 Unspecified atrial fibrillation: Secondary | ICD-10-CM

## 2013-06-07 DIAGNOSIS — I2699 Other pulmonary embolism without acute cor pulmonale: Secondary | ICD-10-CM

## 2013-06-07 LAB — POCT INR: INR: 3

## 2013-06-09 ENCOUNTER — Other Ambulatory Visit: Payer: Self-pay | Admitting: Cardiology

## 2013-06-20 ENCOUNTER — Other Ambulatory Visit: Payer: Self-pay

## 2013-07-12 ENCOUNTER — Ambulatory Visit (INDEPENDENT_AMBULATORY_CARE_PROVIDER_SITE_OTHER): Payer: Medicare Other | Admitting: *Deleted

## 2013-07-12 DIAGNOSIS — I2699 Other pulmonary embolism without acute cor pulmonale: Secondary | ICD-10-CM

## 2013-07-12 DIAGNOSIS — I4891 Unspecified atrial fibrillation: Secondary | ICD-10-CM

## 2013-07-12 DIAGNOSIS — Z7901 Long term (current) use of anticoagulants: Secondary | ICD-10-CM

## 2013-07-12 LAB — POCT INR: INR: 3

## 2013-07-31 ENCOUNTER — Ambulatory Visit (INDEPENDENT_AMBULATORY_CARE_PROVIDER_SITE_OTHER): Payer: Medicare Other | Admitting: Pharmacist

## 2013-07-31 DIAGNOSIS — I2699 Other pulmonary embolism without acute cor pulmonale: Secondary | ICD-10-CM

## 2013-07-31 DIAGNOSIS — Z7901 Long term (current) use of anticoagulants: Secondary | ICD-10-CM

## 2013-07-31 DIAGNOSIS — I4891 Unspecified atrial fibrillation: Secondary | ICD-10-CM

## 2013-07-31 LAB — POCT INR: INR: 3.5

## 2013-07-31 MED ORDER — WARFARIN SODIUM 2.5 MG PO TABS: ORAL_TABLET | ORAL | Status: DC

## 2013-08-14 ENCOUNTER — Ambulatory Visit (INDEPENDENT_AMBULATORY_CARE_PROVIDER_SITE_OTHER): Payer: Medicare Other | Admitting: *Deleted

## 2013-08-14 DIAGNOSIS — Z7901 Long term (current) use of anticoagulants: Secondary | ICD-10-CM

## 2013-08-14 DIAGNOSIS — I4891 Unspecified atrial fibrillation: Secondary | ICD-10-CM

## 2013-08-14 DIAGNOSIS — I2699 Other pulmonary embolism without acute cor pulmonale: Secondary | ICD-10-CM

## 2013-08-14 LAB — POCT INR: INR: 2.6

## 2013-08-31 ENCOUNTER — Ambulatory Visit (INDEPENDENT_AMBULATORY_CARE_PROVIDER_SITE_OTHER): Payer: Medicare Other | Admitting: Pharmacist

## 2013-08-31 DIAGNOSIS — I4891 Unspecified atrial fibrillation: Secondary | ICD-10-CM

## 2013-08-31 DIAGNOSIS — Z7901 Long term (current) use of anticoagulants: Secondary | ICD-10-CM

## 2013-08-31 DIAGNOSIS — I2699 Other pulmonary embolism without acute cor pulmonale: Secondary | ICD-10-CM

## 2013-08-31 LAB — POCT INR: INR: 2.2

## 2013-09-20 ENCOUNTER — Other Ambulatory Visit: Payer: Self-pay

## 2013-09-27 ENCOUNTER — Ambulatory Visit (INDEPENDENT_AMBULATORY_CARE_PROVIDER_SITE_OTHER): Payer: Medicare Other | Admitting: *Deleted

## 2013-09-27 DIAGNOSIS — I4891 Unspecified atrial fibrillation: Secondary | ICD-10-CM

## 2013-09-27 DIAGNOSIS — I2699 Other pulmonary embolism without acute cor pulmonale: Secondary | ICD-10-CM

## 2013-09-27 DIAGNOSIS — Z7901 Long term (current) use of anticoagulants: Secondary | ICD-10-CM

## 2013-09-27 LAB — POCT INR: INR: 2.5

## 2013-10-25 ENCOUNTER — Ambulatory Visit (INDEPENDENT_AMBULATORY_CARE_PROVIDER_SITE_OTHER): Payer: Medicare Other | Admitting: *Deleted

## 2013-10-25 DIAGNOSIS — I2699 Other pulmonary embolism without acute cor pulmonale: Secondary | ICD-10-CM

## 2013-10-25 DIAGNOSIS — I4891 Unspecified atrial fibrillation: Secondary | ICD-10-CM

## 2013-10-25 DIAGNOSIS — Z7901 Long term (current) use of anticoagulants: Secondary | ICD-10-CM

## 2013-11-16 ENCOUNTER — Ambulatory Visit (INDEPENDENT_AMBULATORY_CARE_PROVIDER_SITE_OTHER): Payer: Medicare Other | Admitting: *Deleted

## 2013-11-16 DIAGNOSIS — I4891 Unspecified atrial fibrillation: Secondary | ICD-10-CM

## 2013-11-16 DIAGNOSIS — Z7901 Long term (current) use of anticoagulants: Secondary | ICD-10-CM

## 2013-11-16 DIAGNOSIS — I2699 Other pulmonary embolism without acute cor pulmonale: Secondary | ICD-10-CM

## 2013-11-16 LAB — POCT INR: INR: 2

## 2013-12-14 ENCOUNTER — Ambulatory Visit (INDEPENDENT_AMBULATORY_CARE_PROVIDER_SITE_OTHER): Payer: Medicare Other | Admitting: *Deleted

## 2013-12-14 DIAGNOSIS — I2699 Other pulmonary embolism without acute cor pulmonale: Secondary | ICD-10-CM

## 2013-12-14 DIAGNOSIS — Z7901 Long term (current) use of anticoagulants: Secondary | ICD-10-CM

## 2013-12-14 DIAGNOSIS — I4891 Unspecified atrial fibrillation: Secondary | ICD-10-CM

## 2013-12-14 LAB — POCT INR: INR: 2.3

## 2014-01-17 ENCOUNTER — Ambulatory Visit (INDEPENDENT_AMBULATORY_CARE_PROVIDER_SITE_OTHER): Payer: Medicare Other | Admitting: *Deleted

## 2014-01-17 DIAGNOSIS — I4891 Unspecified atrial fibrillation: Secondary | ICD-10-CM

## 2014-01-17 DIAGNOSIS — I2699 Other pulmonary embolism without acute cor pulmonale: Secondary | ICD-10-CM

## 2014-01-17 DIAGNOSIS — Z7901 Long term (current) use of anticoagulants: Secondary | ICD-10-CM

## 2014-01-17 LAB — POCT INR: INR: 2

## 2014-02-28 ENCOUNTER — Ambulatory Visit (INDEPENDENT_AMBULATORY_CARE_PROVIDER_SITE_OTHER): Payer: Medicare Other | Admitting: Pharmacist Clinician (PhC)/ Clinical Pharmacy Specialist

## 2014-02-28 DIAGNOSIS — Z7901 Long term (current) use of anticoagulants: Secondary | ICD-10-CM

## 2014-02-28 DIAGNOSIS — I4891 Unspecified atrial fibrillation: Secondary | ICD-10-CM

## 2014-02-28 DIAGNOSIS — I2699 Other pulmonary embolism without acute cor pulmonale: Secondary | ICD-10-CM

## 2014-02-28 LAB — POCT INR: INR: 2

## 2014-04-03 ENCOUNTER — Other Ambulatory Visit: Payer: Self-pay | Admitting: Cardiology

## 2014-04-11 ENCOUNTER — Ambulatory Visit (INDEPENDENT_AMBULATORY_CARE_PROVIDER_SITE_OTHER): Payer: Medicare Other | Admitting: Pharmacist Clinician (PhC)/ Clinical Pharmacy Specialist

## 2014-04-11 DIAGNOSIS — I4891 Unspecified atrial fibrillation: Secondary | ICD-10-CM

## 2014-04-11 DIAGNOSIS — Z7901 Long term (current) use of anticoagulants: Secondary | ICD-10-CM

## 2014-04-11 DIAGNOSIS — I2699 Other pulmonary embolism without acute cor pulmonale: Secondary | ICD-10-CM

## 2014-04-11 LAB — POCT INR: INR: 1.6

## 2014-05-02 ENCOUNTER — Ambulatory Visit (INDEPENDENT_AMBULATORY_CARE_PROVIDER_SITE_OTHER): Payer: Medicare Other | Admitting: *Deleted

## 2014-05-02 DIAGNOSIS — Z7901 Long term (current) use of anticoagulants: Secondary | ICD-10-CM

## 2014-05-02 DIAGNOSIS — I2699 Other pulmonary embolism without acute cor pulmonale: Secondary | ICD-10-CM

## 2014-05-02 DIAGNOSIS — I4891 Unspecified atrial fibrillation: Secondary | ICD-10-CM

## 2014-05-02 LAB — POCT INR: INR: 2.1

## 2014-05-30 ENCOUNTER — Ambulatory Visit (INDEPENDENT_AMBULATORY_CARE_PROVIDER_SITE_OTHER): Payer: Medicare Other

## 2014-05-30 DIAGNOSIS — I4891 Unspecified atrial fibrillation: Secondary | ICD-10-CM

## 2014-05-30 DIAGNOSIS — Z7901 Long term (current) use of anticoagulants: Secondary | ICD-10-CM

## 2014-05-30 DIAGNOSIS — I2699 Other pulmonary embolism without acute cor pulmonale: Secondary | ICD-10-CM

## 2014-05-30 LAB — POCT INR: INR: 2.6

## 2014-06-27 ENCOUNTER — Ambulatory Visit (INDEPENDENT_AMBULATORY_CARE_PROVIDER_SITE_OTHER): Payer: Medicare Other | Admitting: *Deleted

## 2014-06-27 DIAGNOSIS — Z7901 Long term (current) use of anticoagulants: Secondary | ICD-10-CM

## 2014-06-27 DIAGNOSIS — I4891 Unspecified atrial fibrillation: Secondary | ICD-10-CM

## 2014-06-27 DIAGNOSIS — I2699 Other pulmonary embolism without acute cor pulmonale: Secondary | ICD-10-CM

## 2014-06-27 LAB — POCT INR: INR: 2.3

## 2014-07-25 ENCOUNTER — Ambulatory Visit (INDEPENDENT_AMBULATORY_CARE_PROVIDER_SITE_OTHER): Payer: Medicare Other | Admitting: Pharmacist Clinician (PhC)/ Clinical Pharmacy Specialist

## 2014-07-25 DIAGNOSIS — I2699 Other pulmonary embolism without acute cor pulmonale: Secondary | ICD-10-CM

## 2014-07-25 DIAGNOSIS — I4891 Unspecified atrial fibrillation: Secondary | ICD-10-CM

## 2014-07-25 DIAGNOSIS — Z7901 Long term (current) use of anticoagulants: Secondary | ICD-10-CM

## 2014-07-25 LAB — POCT INR: INR: 2

## 2014-08-30 ENCOUNTER — Other Ambulatory Visit: Payer: Self-pay

## 2014-09-05 ENCOUNTER — Ambulatory Visit (INDEPENDENT_AMBULATORY_CARE_PROVIDER_SITE_OTHER): Payer: Medicare Other

## 2014-09-05 DIAGNOSIS — I2699 Other pulmonary embolism without acute cor pulmonale: Secondary | ICD-10-CM

## 2014-09-05 DIAGNOSIS — Z7901 Long term (current) use of anticoagulants: Secondary | ICD-10-CM

## 2014-09-05 DIAGNOSIS — I4891 Unspecified atrial fibrillation: Secondary | ICD-10-CM

## 2014-09-05 LAB — POCT INR: INR: 1.7

## 2014-10-03 ENCOUNTER — Ambulatory Visit (INDEPENDENT_AMBULATORY_CARE_PROVIDER_SITE_OTHER): Payer: Medicare Other

## 2014-10-03 DIAGNOSIS — I2699 Other pulmonary embolism without acute cor pulmonale: Secondary | ICD-10-CM

## 2014-10-03 DIAGNOSIS — I4891 Unspecified atrial fibrillation: Secondary | ICD-10-CM

## 2014-10-03 DIAGNOSIS — Z7901 Long term (current) use of anticoagulants: Secondary | ICD-10-CM

## 2014-10-03 LAB — POCT INR: INR: 2

## 2014-10-31 ENCOUNTER — Ambulatory Visit (INDEPENDENT_AMBULATORY_CARE_PROVIDER_SITE_OTHER): Payer: Medicare Other

## 2014-10-31 DIAGNOSIS — Z7901 Long term (current) use of anticoagulants: Secondary | ICD-10-CM

## 2014-10-31 DIAGNOSIS — I4891 Unspecified atrial fibrillation: Secondary | ICD-10-CM

## 2014-10-31 DIAGNOSIS — I2699 Other pulmonary embolism without acute cor pulmonale: Secondary | ICD-10-CM

## 2014-10-31 LAB — POCT INR: INR: 2

## 2014-11-18 ENCOUNTER — Other Ambulatory Visit: Payer: Self-pay

## 2014-11-18 MED ORDER — WARFARIN SODIUM 2.5 MG PO TABS: ORAL_TABLET | ORAL | Status: DC

## 2014-11-19 ENCOUNTER — Other Ambulatory Visit: Payer: Self-pay | Admitting: *Deleted

## 2014-11-19 ENCOUNTER — Telehealth: Payer: Self-pay

## 2014-11-19 MED ORDER — WARFARIN SODIUM 2.5 MG PO TABS
ORAL_TABLET | ORAL | Status: AC
Start: 2014-11-19 — End: ?

## 2014-11-19 MED ORDER — WARFARIN SODIUM 2.5 MG PO TABS: ORAL_TABLET | ORAL | Status: DC

## 2014-11-19 NOTE — Telephone Encounter (Signed)
Warfarin refill sent to pharmacy as requested/ewj

## 2014-11-28 ENCOUNTER — Ambulatory Visit (INDEPENDENT_AMBULATORY_CARE_PROVIDER_SITE_OTHER): Payer: Medicare Other

## 2014-11-28 DIAGNOSIS — I4891 Unspecified atrial fibrillation: Secondary | ICD-10-CM

## 2014-11-28 DIAGNOSIS — Z7901 Long term (current) use of anticoagulants: Secondary | ICD-10-CM

## 2014-11-28 DIAGNOSIS — I2699 Other pulmonary embolism without acute cor pulmonale: Secondary | ICD-10-CM

## 2014-11-28 LAB — POCT INR: INR: 2.5

## 2014-12-26 ENCOUNTER — Ambulatory Visit (INDEPENDENT_AMBULATORY_CARE_PROVIDER_SITE_OTHER): Payer: Medicare Other

## 2014-12-26 DIAGNOSIS — I4891 Unspecified atrial fibrillation: Secondary | ICD-10-CM

## 2014-12-26 DIAGNOSIS — Z7901 Long term (current) use of anticoagulants: Secondary | ICD-10-CM

## 2014-12-26 DIAGNOSIS — I2699 Other pulmonary embolism without acute cor pulmonale: Secondary | ICD-10-CM

## 2014-12-26 LAB — POCT INR: INR: 2.1

## 2015-01-10 ENCOUNTER — Other Ambulatory Visit (HOSPITAL_COMMUNITY): Payer: Self-pay | Admitting: *Deleted

## 2015-01-13 ENCOUNTER — Ambulatory Visit (HOSPITAL_COMMUNITY)
Admission: RE | Admit: 2015-01-13 | Discharge: 2015-01-13 | Disposition: A | Discharge status: Home / Self Care | Payer: Medicare Other | Source: Ambulatory Visit | Attending: Nephrology | Admitting: Nephrology

## 2015-01-13 DIAGNOSIS — D631 Anemia in chronic kidney disease: Secondary | ICD-10-CM | POA: Insufficient documentation

## 2015-01-13 DIAGNOSIS — N185 Chronic kidney disease, stage 5: Secondary | ICD-10-CM | POA: Insufficient documentation

## 2015-01-13 LAB — POCT HEMOGLOBIN-HEMACUE: HEMOGLOBIN: 9.6 g/dL — AB (ref 13.0–17.0)

## 2015-01-13 MED ORDER — DARBEPOETIN ALFA 100 MCG/0.5ML IJ SOSY
100.0000 ug | PREFILLED_SYRINGE | INTRAMUSCULAR | Status: DC
  Administered 2015-01-13: 100 ug via SUBCUTANEOUS

## 2015-01-13 MED ORDER — DARBEPOETIN ALFA 100 MCG/0.5ML IJ SOSY
PREFILLED_SYRINGE | INTRAMUSCULAR | Status: DC
Start: 2015-01-13 — End: 2015-01-14
  Filled 2015-01-13: qty 0.5

## 2015-01-13 NOTE — Discharge Instructions (Signed)
Darbepoetin Alfa injection What is this medicine? DARBEPOETIN ALFA (dar be POE e tin AL fa) helps your body make more red blood cells. It is used to treat anemia caused by chronic kidney failure and chemotherapy. This medicine may be used for other purposes; ask your health care provider or pharmacist if you have questions. COMMON BRAND NAME(S): Aranesp What should I tell my health care provider before I take this medicine? They need to know if you have any of these conditions: -blood clotting disorders or history of blood clots -cancer patient not on chemotherapy -cystic fibrosis -heart disease, such as angina, heart failure, or a history of a heart attack -hemoglobin level of 12 g/dL or greater -high blood pressure -low levels of folate, iron, or vitamin B12 -seizures -an unusual or allergic reaction to darbepoetin, erythropoietin, albumin, hamster proteins, latex, other medicines, foods, dyes, or preservatives -pregnant or trying to get pregnant -breast-feeding How should I use this medicine? This medicine is for injection into a vein or under the skin. It is usually given by a health care professional in a hospital or clinic setting. If you get this medicine at home, you will be taught how to prepare and give this medicine. Do not shake the solution before you withdraw a dose. Use exactly as directed. Take your medicine at regular intervals. Do not take your medicine more often than directed. It is important that you put your used needles and syringes in a special sharps container. Do not put them in a trash can. If you do not have a sharps container, call your pharmacist or healthcare provider to get one. Talk to your pediatrician regarding the use of this medicine in children. While this medicine may be used in children as young as 1 year for selected conditions, precautions do apply. Overdosage: If you think you have taken too much of this medicine contact a poison control center or  emergency room at once. NOTE: This medicine is only for you. Do not share this medicine with others. What if I miss a dose? If you miss a dose, take it as soon as you can. If it is almost time for your next dose, take only that dose. Do not take double or extra doses. What may interact with this medicine? Do not take this medicine with any of the following medications: -epoetin alfa This list may not describe all possible interactions. Give your health care provider a list of all the medicines, herbs, non-prescription drugs, or dietary supplements you use. Also tell them if you smoke, drink alcohol, or use illegal drugs. Some items may interact with your medicine. What should I watch for while using this medicine? Visit your prescriber or health care professional for regular checks on your progress and for the needed blood tests and blood pressure measurements. It is especially important for the doctor to make sure your hemoglobin level is in the desired range, to limit the risk of potential side effects and to give you the best benefit. Keep all appointments for any recommended tests. Check your blood pressure as directed. Ask your doctor what your blood pressure should be and when you should contact him or her. As your body makes more red blood cells, you may need to take iron, folic acid, or vitamin B supplements. Ask your doctor or health care provider which products are right for you. If you have kidney disease continue dietary restrictions, even though this medication can make you feel better. Talk with your doctor or health   care professional about the foods you eat and the vitamins that you take. What side effects may I notice from receiving this medicine? Side effects that you should report to your doctor or health care professional as soon as possible: -allergic reactions like skin rash, itching or hives, swelling of the face, lips, or tongue -breathing problems -changes in vision -chest  pain -confusion, trouble speaking or understanding -feeling faint or lightheaded, falls -high blood pressure -muscle aches or pains -pain, swelling, warmth in the leg -rapid weight gain -severe headaches -sudden numbness or weakness of the face, arm or leg -trouble walking, dizziness, loss of balance or coordination -seizures (convulsions) -swelling of the ankles, feet, hands -unusually weak or tired Side effects that usually do not require medical attention (report to your doctor or health care professional if they continue or are bothersome): -diarrhea -fever, chills (flu-like symptoms) -headaches -nausea, vomiting -redness, stinging, or swelling at site where injected This list may not describe all possible side effects. Call your doctor for medical advice about side effects. You may report side effects to FDA at 1-800-FDA-1088. Where should I keep my medicine? Keep out of the reach of children. Store in a refrigerator between 2 and 8 degrees C (36 and 46 degrees F). Do not freeze. Do not shake. Throw away any unused portion if using a single-dose vial. Throw away any unused medicine after the expiration date. NOTE: This sheet is a summary. It may not cover all possible information. If you have questions about this medicine, talk to your doctor, pharmacist, or health care provider.  2015, [employer]. (2008-10-15 10:23:57)  

## 2015-01-23 ENCOUNTER — Ambulatory Visit (INDEPENDENT_AMBULATORY_CARE_PROVIDER_SITE_OTHER): Payer: Medicare Other | Admitting: *Deleted

## 2015-01-23 DIAGNOSIS — I2699 Other pulmonary embolism without acute cor pulmonale: Secondary | ICD-10-CM

## 2015-01-23 DIAGNOSIS — I4891 Unspecified atrial fibrillation: Secondary | ICD-10-CM

## 2015-01-23 DIAGNOSIS — Z7901 Long term (current) use of anticoagulants: Secondary | ICD-10-CM

## 2015-01-23 LAB — POCT INR: INR: 1.8

## 2015-02-10 ENCOUNTER — Encounter (HOSPITAL_COMMUNITY): Payer: Medicare Other

## 2015-02-11 ENCOUNTER — Ambulatory Visit (INDEPENDENT_AMBULATORY_CARE_PROVIDER_SITE_OTHER): Payer: Medicare Other | Admitting: *Deleted

## 2015-02-11 DIAGNOSIS — I2699 Other pulmonary embolism without acute cor pulmonale: Secondary | ICD-10-CM | POA: Diagnosis not present

## 2015-02-11 DIAGNOSIS — I4891 Unspecified atrial fibrillation: Secondary | ICD-10-CM | POA: Diagnosis not present

## 2015-02-11 DIAGNOSIS — Z7901 Long term (current) use of anticoagulants: Secondary | ICD-10-CM | POA: Diagnosis not present

## 2015-02-11 LAB — POCT INR: INR: 2.6

## 2015-02-13 ENCOUNTER — Encounter (HOSPITAL_COMMUNITY)
Admission: RE | Admit: 2015-02-13 | Discharge: 2015-02-13 | Disposition: A | Discharge status: Home / Self Care | Payer: Medicare Other | Source: Ambulatory Visit | Attending: Nephrology | Admitting: Nephrology

## 2015-02-13 DIAGNOSIS — N185 Chronic kidney disease, stage 5: Secondary | ICD-10-CM | POA: Insufficient documentation

## 2015-02-13 DIAGNOSIS — Z5181 Encounter for therapeutic drug level monitoring: Secondary | ICD-10-CM | POA: Insufficient documentation

## 2015-02-13 DIAGNOSIS — D631 Anemia in chronic kidney disease: Secondary | ICD-10-CM | POA: Insufficient documentation

## 2015-02-13 DIAGNOSIS — Z79899 Other long term (current) drug therapy: Secondary | ICD-10-CM | POA: Insufficient documentation

## 2015-02-13 LAB — FERRITIN: Ferritin: 280 ng/mL (ref 22–322)

## 2015-02-13 LAB — IRON AND TIBC
Iron: 57 ug/dL (ref 42–165)
SATURATION RATIOS: 29 % (ref 20–55)
TIBC: 197 ug/dL — ABNORMAL LOW (ref 215–435)
UIBC: 140 ug/dL (ref 125–400)

## 2015-02-13 LAB — POCT HEMOGLOBIN-HEMACUE: Hemoglobin: 10.5 g/dL — ABNORMAL LOW (ref 13.0–17.0)

## 2015-02-13 MED ORDER — DARBEPOETIN ALFA 100 MCG/0.5ML IJ SOSY
PREFILLED_SYRINGE | INTRAMUSCULAR | Status: AC
  Administered 2015-02-13: 100 ug via SUBCUTANEOUS
  Filled 2015-02-13: qty 0.5

## 2015-02-13 MED ORDER — DARBEPOETIN ALFA 100 MCG/0.5ML IJ SOSY: 100.0000 ug | PREFILLED_SYRINGE | INTRAMUSCULAR | Status: DC

## 2015-02-27 ENCOUNTER — Ambulatory Visit (INDEPENDENT_AMBULATORY_CARE_PROVIDER_SITE_OTHER): Payer: Medicare Other | Admitting: Surgery

## 2015-02-27 DIAGNOSIS — I4891 Unspecified atrial fibrillation: Secondary | ICD-10-CM

## 2015-02-27 DIAGNOSIS — I2699 Other pulmonary embolism without acute cor pulmonale: Secondary | ICD-10-CM | POA: Diagnosis not present

## 2015-02-27 DIAGNOSIS — Z7901 Long term (current) use of anticoagulants: Secondary | ICD-10-CM

## 2015-02-27 LAB — POCT INR: INR: 2

## 2015-03-13 ENCOUNTER — Encounter (HOSPITAL_COMMUNITY): Payer: Medicare Other

## 2015-03-20 ENCOUNTER — Ambulatory Visit (INDEPENDENT_AMBULATORY_CARE_PROVIDER_SITE_OTHER): Payer: Medicare Other | Admitting: *Deleted

## 2015-03-20 DIAGNOSIS — Z7901 Long term (current) use of anticoagulants: Secondary | ICD-10-CM | POA: Diagnosis not present

## 2015-03-20 DIAGNOSIS — I2699 Other pulmonary embolism without acute cor pulmonale: Secondary | ICD-10-CM | POA: Diagnosis not present

## 2015-03-20 DIAGNOSIS — I4891 Unspecified atrial fibrillation: Secondary | ICD-10-CM

## 2015-03-20 LAB — POCT INR: INR: 1.8

## 2015-04-10 ENCOUNTER — Ambulatory Visit (INDEPENDENT_AMBULATORY_CARE_PROVIDER_SITE_OTHER): Payer: Medicare Other

## 2015-04-10 DIAGNOSIS — I2699 Other pulmonary embolism without acute cor pulmonale: Secondary | ICD-10-CM

## 2015-04-10 DIAGNOSIS — I4891 Unspecified atrial fibrillation: Secondary | ICD-10-CM | POA: Diagnosis not present

## 2015-04-10 DIAGNOSIS — Z7901 Long term (current) use of anticoagulants: Secondary | ICD-10-CM

## 2015-04-10 LAB — POCT INR: INR: 2.1

## 2015-05-12 ENCOUNTER — Other Ambulatory Visit: Payer: Self-pay

## 2015-06-06 ENCOUNTER — Ambulatory Visit (INDEPENDENT_AMBULATORY_CARE_PROVIDER_SITE_OTHER): Payer: Medicare Other | Admitting: *Deleted

## 2015-06-06 DIAGNOSIS — I2699 Other pulmonary embolism without acute cor pulmonale: Secondary | ICD-10-CM | POA: Diagnosis not present

## 2015-06-06 DIAGNOSIS — Z7901 Long term (current) use of anticoagulants: Secondary | ICD-10-CM | POA: Diagnosis not present

## 2015-06-06 DIAGNOSIS — I4891 Unspecified atrial fibrillation: Secondary | ICD-10-CM | POA: Diagnosis not present

## 2015-06-06 LAB — POCT INR: INR: 3.2

## 2015-06-08 ENCOUNTER — Emergency Department (HOSPITAL_COMMUNITY): Payer: Medicare Other

## 2015-06-08 ENCOUNTER — Encounter (HOSPITAL_COMMUNITY): Payer: Self-pay | Admitting: *Deleted

## 2015-06-08 ENCOUNTER — Inpatient Hospital Stay (HOSPITAL_COMMUNITY)
Admission: EM | Admit: 2015-06-08 | Discharge: 2015-06-16 | DRG: 813 | Disposition: E | Payer: Medicare Other | Attending: Internal Medicine | Admitting: Internal Medicine

## 2015-06-08 DIAGNOSIS — N281 Cyst of kidney, acquired: Secondary | ICD-10-CM

## 2015-06-08 DIAGNOSIS — E875 Hyperkalemia: Secondary | ICD-10-CM | POA: Diagnosis present

## 2015-06-08 DIAGNOSIS — D62 Acute posthemorrhagic anemia: Secondary | ICD-10-CM | POA: Diagnosis present

## 2015-06-08 DIAGNOSIS — Z86711 Personal history of pulmonary embolism: Secondary | ICD-10-CM

## 2015-06-08 DIAGNOSIS — E669 Obesity, unspecified: Secondary | ICD-10-CM | POA: Diagnosis present

## 2015-06-08 DIAGNOSIS — I120 Hypertensive chronic kidney disease with stage 5 chronic kidney disease or end stage renal disease: Secondary | ICD-10-CM | POA: Diagnosis present

## 2015-06-08 DIAGNOSIS — N2889 Other specified disorders of kidney and ureter: Secondary | ICD-10-CM | POA: Diagnosis not present

## 2015-06-08 DIAGNOSIS — R6520 Severe sepsis without septic shock: Secondary | ICD-10-CM | POA: Diagnosis present

## 2015-06-08 DIAGNOSIS — R680 Hypothermia, not associated with low environmental temperature: Secondary | ICD-10-CM | POA: Diagnosis present

## 2015-06-08 DIAGNOSIS — T45515A Adverse effect of anticoagulants, initial encounter: Secondary | ICD-10-CM | POA: Diagnosis present

## 2015-06-08 DIAGNOSIS — M545 Low back pain: Secondary | ICD-10-CM | POA: Diagnosis present

## 2015-06-08 DIAGNOSIS — K661 Hemoperitoneum: Secondary | ICD-10-CM | POA: Diagnosis present

## 2015-06-08 DIAGNOSIS — Z7901 Long term (current) use of anticoagulants: Secondary | ICD-10-CM | POA: Diagnosis not present

## 2015-06-08 DIAGNOSIS — R578 Other shock: Secondary | ICD-10-CM

## 2015-06-08 DIAGNOSIS — Z87891 Personal history of nicotine dependence: Secondary | ICD-10-CM | POA: Diagnosis not present

## 2015-06-08 DIAGNOSIS — N185 Chronic kidney disease, stage 5: Secondary | ICD-10-CM | POA: Diagnosis present

## 2015-06-08 DIAGNOSIS — J9600 Acute respiratory failure, unspecified whether with hypoxia or hypercapnia: Secondary | ICD-10-CM | POA: Diagnosis not present

## 2015-06-08 DIAGNOSIS — S37011A Minor contusion of right kidney, initial encounter: Secondary | ICD-10-CM

## 2015-06-08 DIAGNOSIS — N179 Acute kidney failure, unspecified: Secondary | ICD-10-CM

## 2015-06-08 DIAGNOSIS — Z8551 Personal history of malignant neoplasm of bladder: Secondary | ICD-10-CM

## 2015-06-08 DIAGNOSIS — D696 Thrombocytopenia, unspecified: Secondary | ICD-10-CM | POA: Diagnosis present

## 2015-06-08 DIAGNOSIS — N401 Enlarged prostate with lower urinary tract symptoms: Secondary | ICD-10-CM

## 2015-06-08 DIAGNOSIS — R55 Syncope and collapse: Secondary | ICD-10-CM | POA: Diagnosis present

## 2015-06-08 DIAGNOSIS — D6832 Hemorrhagic disorder due to extrinsic circulating anticoagulants: Principal | ICD-10-CM | POA: Diagnosis present

## 2015-06-08 DIAGNOSIS — Z789 Other specified health status: Secondary | ICD-10-CM | POA: Diagnosis not present

## 2015-06-08 DIAGNOSIS — E872 Acidosis: Secondary | ICD-10-CM | POA: Diagnosis present

## 2015-06-08 DIAGNOSIS — I480 Paroxysmal atrial fibrillation: Secondary | ICD-10-CM

## 2015-06-08 DIAGNOSIS — N138 Other obstructive and reflux uropathy: Secondary | ICD-10-CM | POA: Diagnosis present

## 2015-06-08 DIAGNOSIS — Z515 Encounter for palliative care: Secondary | ICD-10-CM | POA: Diagnosis not present

## 2015-06-08 DIAGNOSIS — N170 Acute kidney failure with tubular necrosis: Secondary | ICD-10-CM | POA: Diagnosis present

## 2015-06-08 DIAGNOSIS — A419 Sepsis, unspecified organism: Secondary | ICD-10-CM | POA: Diagnosis present

## 2015-06-08 DIAGNOSIS — I4891 Unspecified atrial fibrillation: Secondary | ICD-10-CM | POA: Diagnosis present

## 2015-06-08 DIAGNOSIS — E8770 Fluid overload, unspecified: Secondary | ICD-10-CM | POA: Diagnosis present

## 2015-06-08 DIAGNOSIS — S37019A Minor contusion of unspecified kidney, initial encounter: Secondary | ICD-10-CM | POA: Diagnosis present

## 2015-06-08 DIAGNOSIS — Z86718 Personal history of other venous thrombosis and embolism: Secondary | ICD-10-CM | POA: Diagnosis not present

## 2015-06-08 DIAGNOSIS — Z66 Do not resuscitate: Secondary | ICD-10-CM | POA: Diagnosis present

## 2015-06-08 DIAGNOSIS — Z6831 Body mass index (BMI) 31.0-31.9, adult: Secondary | ICD-10-CM | POA: Diagnosis not present

## 2015-06-08 HISTORY — DX: Other pulmonary embolism without acute cor pulmonale: I26.99

## 2015-06-08 HISTORY — DX: Unspecified atrial fibrillation: I48.91

## 2015-06-08 HISTORY — DX: Malignant neoplasm of bladder, unspecified: C67.9

## 2015-06-08 HISTORY — DX: Elevated prostate specific antigen (PSA): R97.20

## 2015-06-08 HISTORY — DX: Minor contusion of left kidney, initial encounter: S37.012A

## 2015-06-08 HISTORY — DX: Acute embolism and thrombosis of unspecified deep veins of unspecified lower extremity: I82.409

## 2015-06-08 HISTORY — DX: Cyst of kidney, acquired: N28.1

## 2015-06-08 HISTORY — DX: Other specified disorders of kidney and ureter: N28.89

## 2015-06-08 HISTORY — DX: Other obstructive and reflux uropathy: N40.1

## 2015-06-08 HISTORY — DX: Other obstructive and reflux uropathy: N13.8

## 2015-06-08 HISTORY — DX: Pre-excitation syndrome: I45.6

## 2015-06-08 HISTORY — DX: Chronic kidney disease, stage 4 (severe): N18.4

## 2015-06-08 LAB — CBC WITH DIFFERENTIAL/PLATELET
BASOS PCT: 0 % (ref 0–1)
Basophils Absolute: 0 10*3/uL (ref 0.0–0.1)
EOS ABS: 0 10*3/uL (ref 0.0–0.7)
Eosinophils Relative: 0 % (ref 0–5)
HCT: 19.1 % — ABNORMAL LOW (ref 39.0–52.0)
Hemoglobin: 6.3 g/dL — CL (ref 13.0–17.0)
LYMPHS ABS: 0.9 10*3/uL (ref 0.7–4.0)
LYMPHS PCT: 8 % — AB (ref 12–46)
MCH: 29.7 pg (ref 26.0–34.0)
MCHC: 33 g/dL (ref 30.0–36.0)
MCV: 90.1 fL (ref 78.0–100.0)
Monocytes Absolute: 0.5 10*3/uL (ref 0.1–1.0)
Monocytes Relative: 4 % (ref 3–12)
NEUTROS ABS: 10.1 10*3/uL — AB (ref 1.7–7.7)
Neutrophils Relative %: 88 % — ABNORMAL HIGH (ref 43–77)
PLATELETS: 163 10*3/uL (ref 150–400)
RBC: 2.12 MIL/uL — AB (ref 4.22–5.81)
RDW: 16 % — ABNORMAL HIGH (ref 11.5–15.5)
WBC: 11.6 10*3/uL — AB (ref 4.0–10.5)

## 2015-06-08 LAB — COMPREHENSIVE METABOLIC PANEL
ALK PHOS: 50 U/L (ref 38–126)
ALT: 9 U/L — ABNORMAL LOW (ref 17–63)
AST: 12 U/L — AB (ref 15–41)
Albumin: 2.4 g/dL — ABNORMAL LOW (ref 3.5–5.0)
Anion gap: 13 (ref 5–15)
BUN: 98 mg/dL — ABNORMAL HIGH (ref 6–20)
CALCIUM: 9.3 mg/dL (ref 8.9–10.3)
CO2: 12 mmol/L — ABNORMAL LOW (ref 22–32)
Chloride: 117 mmol/L — ABNORMAL HIGH (ref 101–111)
Creatinine, Ser: 10.63 mg/dL — ABNORMAL HIGH (ref 0.61–1.24)
GFR calc Af Amer: 5 mL/min — ABNORMAL LOW (ref >60)
GFR calc non Af Amer: 4 mL/min — ABNORMAL LOW (ref >60)
Glucose, Bld: 180 mg/dL — ABNORMAL HIGH (ref 65–99)
Potassium: 5.2 mmol/L — ABNORMAL HIGH (ref 3.5–5.1)
SODIUM: 142 mmol/L (ref 135–145)
Total Bilirubin: 0.6 mg/dL (ref 0.3–1.2)
Total Protein: 5.7 g/dL — ABNORMAL LOW (ref 6.5–8.1)

## 2015-06-08 LAB — PREPARE FRESH FROZEN PLASMA
Unit division: 0
Unit division: 0

## 2015-06-08 LAB — CBC
HCT: 21.6 % — ABNORMAL LOW (ref 39.0–52.0)
Hemoglobin: 7 g/dL — ABNORMAL LOW (ref 13.0–17.0)
MCH: 30.7 pg (ref 26.0–34.0)
MCHC: 32.4 g/dL (ref 30.0–36.0)
MCV: 94.7 fL (ref 78.0–100.0)
PLATELETS: 250 10*3/uL (ref 150–400)
RBC: 2.28 MIL/uL — AB (ref 4.22–5.81)
RDW: 15.5 % (ref 11.5–15.5)
WBC: 13.4 10*3/uL — ABNORMAL HIGH (ref 4.0–10.5)

## 2015-06-08 LAB — I-STAT CG4 LACTIC ACID, ED: Lactic Acid, Venous: 2.25 mmol/L (ref 0.5–2.0)

## 2015-06-08 LAB — MRSA PCR SCREENING: MRSA by PCR: NEGATIVE

## 2015-06-08 LAB — PROTIME-INR
INR: 2.78 — AB (ref 0.00–1.49)
Prothrombin Time: 28.9 seconds — ABNORMAL HIGH (ref 11.6–15.2)

## 2015-06-08 LAB — TROPONIN I

## 2015-06-08 LAB — PREPARE RBC (CROSSMATCH)

## 2015-06-08 LAB — POC OCCULT BLOOD, ED: Fecal Occult Bld: NEGATIVE

## 2015-06-08 MED ORDER — SODIUM CHLORIDE 0.9 % IV SOLN
INTRAVENOUS | Status: DC
  Administered 2015-06-08: 15:00:00 via INTRAVENOUS

## 2015-06-08 MED ORDER — SODIUM CHLORIDE 0.9 % IV SOLN
Freq: Once | INTRAVENOUS | Status: AC
  Administered 2015-06-08: 18:00:00 via INTRAVENOUS

## 2015-06-08 MED ORDER — SODIUM CHLORIDE 0.9 % IV SOLN
10.0000 mL/h | Freq: Once | INTRAVENOUS | Status: AC
  Administered 2015-06-08: 10 mL/h via INTRAVENOUS

## 2015-06-08 MED ORDER — SODIUM BICARBONATE 650 MG PO TABS
650.0000 mg | ORAL_TABLET | Freq: Three times a day (TID) | ORAL | Status: DC
  Administered 2015-06-08 – 2015-06-09 (×3): 650 mg via ORAL
  Filled 2015-06-08 (×7): qty 1

## 2015-06-08 MED ORDER — FUROSEMIDE 10 MG/ML IJ SOLN
INTRAMUSCULAR | Status: AC
  Filled 2015-06-08: qty 4

## 2015-06-08 MED ORDER — SODIUM POLYSTYRENE SULFONATE 15 GM/60ML PO SUSP
30.0000 g | Freq: Every day | ORAL | Status: DC
  Filled 2015-06-08: qty 120

## 2015-06-08 MED ORDER — SODIUM CHLORIDE 0.9 % IV SOLN
INTRAVENOUS | Status: AC
  Administered 2015-06-08: 12:00:00 via INTRAVENOUS

## 2015-06-08 MED ORDER — ACETAMINOPHEN 650 MG RE SUPP: 650.0000 mg | Freq: Four times a day (QID) | RECTAL | Status: DC | PRN

## 2015-06-08 MED ORDER — SODIUM CHLORIDE 0.9 % IV BOLUS (SEPSIS)
2000.0000 mL | Freq: Once | INTRAVENOUS | Status: AC
  Administered 2015-06-08: 2000 mL via INTRAVENOUS

## 2015-06-08 MED ORDER — HYDROCODONE-ACETAMINOPHEN 5-325 MG PO TABS
1.0000 | ORAL_TABLET | Freq: Four times a day (QID) | ORAL | Status: DC | PRN
Start: 2015-06-08 — End: 2015-06-10
  Administered 2015-06-08: 1 via ORAL
  Filled 2015-06-08: qty 1

## 2015-06-08 MED ORDER — ONDANSETRON HCL 4 MG/2ML IJ SOLN: 4.0000 mg | Freq: Four times a day (QID) | INTRAMUSCULAR | Status: DC | PRN

## 2015-06-08 MED ORDER — SODIUM CHLORIDE 0.9 % IV BOLUS (SEPSIS)
1000.0000 mL | Freq: Once | INTRAVENOUS | Status: AC
  Administered 2015-06-08: 1000 mL via INTRAVENOUS

## 2015-06-08 MED ORDER — ONDANSETRON HCL 4 MG PO TABS: 4.0000 mg | ORAL_TABLET | Freq: Four times a day (QID) | ORAL | Status: DC | PRN

## 2015-06-08 MED ORDER — FENTANYL CITRATE (PF) 100 MCG/2ML IJ SOLN
50.0000 ug | Freq: Once | INTRAMUSCULAR | Status: AC
  Administered 2015-06-08: 50 ug via INTRAVENOUS
  Filled 2015-06-08: qty 2

## 2015-06-08 MED ORDER — PATIROMER SORBITEX CALCIUM 8.4 G PO PACK
8.4000 g | PACK | Freq: Every day | ORAL | Status: DC
  Administered 2015-06-08 – 2015-06-09 (×2): 8.4 g via ORAL
  Filled 2015-06-08 (×2): qty 4

## 2015-06-08 MED ORDER — ACETAMINOPHEN 325 MG PO TABS: 650.0000 mg | ORAL_TABLET | Freq: Four times a day (QID) | ORAL | Status: DC | PRN

## 2015-06-08 MED ORDER — DARBEPOETIN ALFA 150 MCG/0.3ML IJ SOSY
150.0000 ug | PREFILLED_SYRINGE | INTRAMUSCULAR | Status: DC
  Administered 2015-06-09: 150 ug via SUBCUTANEOUS
  Filled 2015-06-08 (×2): qty 0.3

## 2015-06-08 MED ORDER — PHYTONADIONE 5 MG PO TABS
5.0000 mg | ORAL_TABLET | Freq: Once | ORAL | Status: AC
  Administered 2015-06-08: 5 mg via ORAL
  Filled 2015-06-08 (×2): qty 1

## 2015-06-08 MED ORDER — FUROSEMIDE 10 MG/ML IJ SOLN
40.0000 mg | Freq: Two times a day (BID) | INTRAMUSCULAR | Status: DC
  Administered 2015-06-08 – 2015-06-09 (×2): 40 mg via INTRAVENOUS
  Filled 2015-06-08 (×3): qty 4

## 2015-06-08 NOTE — Progress Notes (Signed)
Patients has a rectal temperature of 92.1 applied warmed blankets and increased temperature in the room.  Contacted Dr. Darrick Meigs received an order for a warming blanket.  Will continue to monitor.

## 2015-06-08 NOTE — ED Provider Notes (Signed)
CSN: [id number]     Arrival date & time 05/27/2015  2725 History   None    Chief Complaint  Patient presents with  . Loss of Consciousness  . Coagulation Disorder     (Consider location/radiation/quality/duration/timing/severity/associated sxs/prior Treatment) HPI Comments:  Patient from home with acute onset of right-sided low back pain and abdominal swelling this morning. Near syncopal episode while getting the EMS stretcher where he lost pulses. CPR was not performed. Patient hypotensive to 70s over 40s. Patient on Coumadin for history of A. fib and PE. He also has a history of hepatic mass. The family reports that his abdominal area is more swollen than usual. Patient given IV fluids by EMS and started an epinephrine drip. Denies any rectal bleeding. Denies any chest pain, shortness of breath, nausea or vomiting. No fever. Patient is pale and diaphoretic. Initial blood pressure 60/38.  Emergency release blood ordered on arrival.  The history is provided by the patient and the EMS personnel. The history is limited by the condition of the patient.    Past Medical History  Diagnosis Date  . A-fib   . PE (pulmonary embolism)   . Chronic renal insufficiency, stage IV (severe)   . Bladder cancer   . Renal cyst, acquired   . Elevated PSA   . Deep venous thrombosis   . WPW (Wolff-Parkinson-White syndrome)   . Renal hematoma, left 2010  . BPH (benign prostatic hypertrophy) with urinary obstruction    Past Surgical History  Procedure Laterality Date  . Transurethral needle ablation of the prostate  2001  . Hernia repair     Family History  Problem Relation Age of Onset  . Cancer Father    History  Substance Use Topics  . Smoking status: Former Research scientist (life sciences)  . Smokeless tobacco: Not on file  . Alcohol Use: No    Review of Systems  Unable to perform ROS     Allergies  Review of patient's allergies indicates no known allergies.  Home Medications   Prior to Admission  medications   Medication Sig Start Date End Date Taking? Authorizing Provider  calcitRIOL (ROCALTROL) 0.25 MCG capsule Take 0.25 mcg by mouth daily. 06/06/15   Historical Provider, MD  Casanthranol-Docusate Sodium 30-100 MG CAPS Take 1 tablet by mouth at bedtime.    Historical Provider, MD  diltiazem (CARDIZEM CD) 300 MG 24 hr capsule Take 300 mg by mouth Daily.  08/22/12   Historical Provider, MD  fluticasone (FLONASE) 50 MCG/ACT nasal spray Place 2 sprays into both nostrils daily as needed for allergies.  08/22/12   Historical Provider, MD  furosemide (LASIX) 80 MG tablet Take 80 mg by mouth daily. 05/21/15   Historical Provider, MD  Layla Barter 15 GM/60ML suspension Take 30 g by mouth daily.  08/17/12   Historical Provider, MD  metoprolol (TOPROL-XL) 200 MG 24 hr tablet Take 200 mg by mouth Daily.  08/22/12   Historical Provider, MD  NON FORMULARY sensipar one tablet every other day    Historical Provider, MD  patiromer Daryll Drown) 8.4 G packet Take 8.4 g by mouth daily.    Historical Provider, MD  Sevelamer Carbonate (RENVELA PO) Take 1 tablet by mouth daily.    Historical Provider, MD  warfarin (COUMADIN) 2.5 MG tablet Take as directed by Coumadin clinic 11/19/14   Minus Breeding, MD   BP 118/76 mmHg  Pulse 78  Temp(Src) 93.8 F (34.3 C) (Rectal)  Resp 25  Ht 5\' 9"  (1.753 m)  Wt 211 lb 10.3  oz (96 kg)  BMI 31.24 kg/m2  SpO2 100% Physical Exam  Constitutional: He is oriented to person, place, and time. He appears well-developed and well-nourished. He appears distressed.  Diaphoretic, pale appearing  HENT:  Head: Normocephalic and atraumatic.  Mouth/Throat: Oropharynx is clear and moist. No oropharyngeal exudate.  Eyes: Conjunctivae and EOM are normal. Pupils are equal, round, and reactive to light.  Neck: Normal range of motion. Neck supple.  Cardiovascular: Normal rate, regular rhythm and normal heart sounds.   No murmur heard. Pulmonary/Chest: Effort normal and breath sounds normal. No  respiratory distress. He exhibits no tenderness.  Abdominal: Soft. Bowel sounds are normal. He exhibits distension. There is tenderness.  R sided abdominal mass and tenderness  Musculoskeletal: Normal range of motion. He exhibits no edema or tenderness.  Cool extremities, weak peripheral pulses  Neurological: He is alert and oriented to person, place, and time. No cranial nerve deficit. Coordination normal.  Skin: Skin is warm.    ED Course  CENTRAL LINE Date/Time: 05/17/2015 10:29 AM Performed by: Ezequiel Essex Authorized by: Ezequiel Essex Consent: The procedure was performed in an emergent situation. Verbal consent obtained. Risks and benefits: risks, benefits and alternatives were discussed Consent given by: patient Patient understanding: patient states understanding of the procedure being performed Patient identity confirmed: provided demographic data Time out: Immediately prior to procedure a "time out" was called to verify the correct patient, procedure, equipment, support staff and site/side marked as required. Indications: vascular access and central pressure monitoring Anesthesia: local infiltration Local anesthetic: lidocaine 1% without epinephrine Anesthetic total: 5 ml Patient sedated: no Preparation: skin prepped with ChloraPrep Skin prep agent dried: skin prep agent completely dried prior to procedure Sterile barriers: all five maximum sterile barriers used - cap, mask, sterile gown, sterile gloves, and large sterile sheet Hand hygiene: hand hygiene performed prior to central venous catheter insertion Location details: right femoral Patient position: flat Catheter size: 7 Fr Pre-procedure: landmarks identified Ultrasound guidance: yes Sterile ultrasound techniques: sterile gel and sterile probe covers were used Number of attempts: 2 Successful placement: yes Post-procedure: line sutured and dressing applied Assessment: blood return through all ports and free  fluid flow Patient tolerance: Patient tolerated the procedure well with no immediate complications Comments: Initially hit R femoral artery. Pressure held for 5 minutes. No hematoma formation observed.   (including critical care time) Labs Review Labs Reviewed  COMPREHENSIVE METABOLIC PANEL - Abnormal; Notable for the following:    Potassium 5.2 (*)    Chloride 117 (*)    CO2 12 (*)    Glucose, Bld 180 (*)    BUN 98 (*)    Creatinine, Ser 10.63 (*)    Total Protein 5.7 (*)    Albumin 2.4 (*)    AST 12 (*)    ALT 9 (*)    GFR calc non Af Amer 4 (*)    GFR calc Af Amer 5 (*)    All other components within normal limits  CBC - Abnormal; Notable for the following:    WBC 13.4 (*)    RBC 2.28 (*)    Hemoglobin 7.0 (*)    HCT 21.6 (*)    All other components within normal limits  PROTIME-INR - Abnormal; Notable for the following:    Prothrombin Time 28.9 (*)    INR 2.78 (*)    All other components within normal limits  CBC WITH DIFFERENTIAL/PLATELET - Abnormal; Notable for the following:    WBC 11.6 (*)  RBC 2.12 (*)    Hemoglobin 6.3 (*)    HCT 19.1 (*)    RDW 16.0 (*)    Neutrophils Relative % 88 (*)    Neutro Abs 10.1 (*)    Lymphocytes Relative 8 (*)    All other components within normal limits  I-STAT CG4 LACTIC ACID, ED - Abnormal; Notable for the following:    Lactic Acid, Venous 2.25 (*)    All other components within normal limits  MRSA PCR SCREENING  TROPONIN I  URINALYSIS, ROUTINE W REFLEX MICROSCOPIC (NOT AT [facility])  CBC  COMPREHENSIVE METABOLIC PANEL  PROTIME-INR  POC OCCULT BLOOD, ED  POC OCCULT BLOOD, ED  I-STAT CG4 LACTIC ACID, ED  TYPE AND SCREEN  PREPARE FRESH FROZEN PLASMA  PREPARE RBC (CROSSMATCH)  PREPARE FRESH FROZEN PLASMA  PREPARE RBC (CROSSMATCH)    Imaging Review Ct Abdomen Pelvis Wo Contrast  06/10/2015   CLINICAL DATA:  74 year old on long-term anticoagulation for atrial fibrillation and prior pulmonary emboli, presenting to the  emergency department with abdominal distention and hypotension. Evaluate for intra-abdominal bleeding. Current history of stage 4 chronic kidney disease. Remote personal history of bladder cancer. Personal history of left perinephric hematoma in 2010.  EXAM: CT ABDOMEN AND PELVIS WITHOUT CONTRAST  TECHNIQUE: Multidetector CT imaging of the abdomen and pelvis was performed following the standard protocol without IV contrast.  COMPARISON:  08/11/2010 dating back to 11/30/2008.  FINDINGS: Hepatobiliary: Liver normal in size and appearance for the unenhanced technique. Normal-appearing gallbladder without calcified gallstones. No biliary ductal dilation.  Spleen:  Normal in size and appearance.  Pancreas:  Normal in appearance.  No pancreatic ductal dilation.  Adrenal glands:  Normal in appearance.  Genitourinary: Large hematoma involving the right kidney extending into the perinephric space, the retroperitoneum, the root of the mesentery, into the peritoneal cavity with moderate hemoperitoneum and in the right upper quadrant below the liver surrounding the gallbladder and in the paracolic gutters bilaterally.  Numerous cysts involving both kidneys, many of which appear hemorrhagic, significantly increased in number since the prior CTs. Hemorrhagic cysts arising from the anterior and posterior portion of the mid left kidney measure approximately 5 cm and 7 cm respectively. A solid renal mass is difficult to exclude on the unenhanced study, but no discrete solid mass is identified in either kidney. No evidence of urinary tract calculi on either side. Extensive arterial calcification involving the polar branches of both kidneys. Urinary bladder unremarkable.  Marked prostate gland enlargement, maximum measurements approximating 4.5 x 5.6 x 4.8 cm. Normal seminal vesicles.  Gastrointestinal: Stomach normal in appearance for degree of distention. Normal-appearing small bowel. Scattered sigmoid colon diverticula without  evidence of acute diverticulitis. Remainder the colon decompressed and unremarkable. Normal appendix in the right upper pelvis, identified medial to the right perinephric hematoma.  Ascites:  Moderate hemoperitoneum.  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 Visceral artery atherosclerosis. IVC filter in the infrarenal IVC. IVC decompressed consistent with hypotension. Right femoral central venous catheter tip in the right external iliac vein.  Lymphatic:  No pathologic lymphadenopathy in the abdomen or pelvis.  Other findings: None.  Musculoskeletal: Spondylosis involving the lower thoracic and upper lumbar spine. Numerous Schmorl's nodes. Facet degenerative changes involving the lower lumbar spine.  Visualized lower thorax: Heart enlarged likely with left ventricular hypertrophy. Severe aortic valvular calcification. Right coronary artery atherosclerosis. No pericardial effusion. Visualized lung bases clear. Very small right pleural effusion.  IMPRESSION: 1. Large hematoma involving the right kidney with extension into the  perinephric space, the retroperitoneum, and the peritoneal space as there is moderate hemoperitoneum. 2. Innumerable cysts involving both kidneys, many of which are hemorrhagic. A ruptured hemorrhagic cyst from the right kidney may be the source of the hemorrhage. There is no other identifiable source for the hemorrhage. 3. Stable marked prostate gland enlargement dating back to 2011. 4. Very small right pleural effusion. 5. Right femoral central venous catheter tip in the right external iliac vein. I telephoned this critical value/emergency results at the time of interpretation on 06/05/2015 at 11:05 am to Dr. Ezequiel Essex, who verbally acknowledged these results.   Electronically Signed   By: Evangeline Dakin M.D.   On: 06/13/2015 11:09   Dg Chest Portable 1 View  06/10/2015   CLINICAL DATA:  Weakness. History of atrial fibrillation and pulmonary embolus.   EXAM: PORTABLE CHEST - 1 VIEW  COMPARISON:  11/15/2011  FINDINGS: The cardiac silhouette is upper limits of normal in size. The patient has taken a shallower inspiration than on the prior study with mild crowding of the bronchovascular markings centrally. No confluent airspace opacity, overt pulmonary edema, pleural effusion, or pneumothorax is identified, although the far lateral aspect of the right lung base was incompletely imaged. Numerous monitoring leads overlie the chest. Thoracolumbar spondylosis is noted.  IMPRESSION: No active disease.   Electronically Signed   By: Logan Bores   On: 05/25/2015 11:02     EKG Interpretation None      MDM   Final diagnoses:  Renal hematoma, right, initial encounter  Acute renal failure, unspecified acute renal failure type  Hemorrhagic shock   patient from home with acute onset of low back pain, abdominal pain, syncope. Hypotensive on arrival. History of hepatic mass. No known AAA. No chest pain.  Patient is awake and answering questions. IV access established. Patient given IV fluids, O- blood. Patient is pale and diaphoretic.  FOBT negative.  Epinephrine drip started by EMS was weaned off. Blood pressure initially in the 50s and 60s. After resuscitation, BP has improved to the 120s.  Hemoglobin is 7 which is decreased from 10. Heme occult is negative. INR is 2.7.  CT scan shows large renal hematoma with perinephric hematoma and retroperitoneal hemorrhage. There is some hemoperitoneum. Discussed with general surgery as well as urology Dr. Roni Bread both state to manage conservatively with volume resuscitation, blood products and reversal of Coumadin. Will reverse with FFP and vitamin K and not give K Centra at this time as . blood pressure has stabilized.  Discussed with Dr. Lamonte Sakai critical care who feels patient can be admitted to stepdown unit.  Patient also has worsening CKD. Creatinine is 10 from baseline of 6. K is 5.2 without acute EKG changes.  Patient has refused dialysis in the past and continues to refuse dialysis.  He does not want intubation or CPR and wishes to be DNR. Family is aware and updated at bedside.  D/w Dr. Darrick Meigs who will admit.  Dr. Jeffie Pollock of urology to consult.  Nursing staff unsuccessful with foley placement.  EMERGENCY DEPARTMENT [place] FAST EXAM  INDICATIONS:Hypotension and Unstable vital signs  PERFORMED BY: Myself  IMAGES ARCHIVED?: No  FINDINGS: All views negative  LIMITATIONS:  Body habitus and Emergent procedure  INTERPRETATION:  No abdominal free fluid and No pericardial effusion  COMMENT:  No free fluid, large right-sided abdominal mass Aorta not well visualized    CRITICAL CARE Performed by: Ezequiel Essex Total critical care time: 90 Critical care time was exclusive of separately billable  procedures and treating other patients. Critical care was necessary to treat or prevent imminent or life-threatening deterioration. Critical care was time spent personally by me on the following activities: development of treatment plan with patient and/or surrogate as well as nursing, discussions with consultants, evaluation of patient's response to treatment, examination of patient, obtaining history from patient or surrogate, ordering and performing treatments and interventions, ordering and review of laboratory studies, ordering and review of radiographic studies, pulse oximetry and re-evaluation of patient's condition.    Ezequiel Essex, MD 06/05/2015 [phone]

## 2015-06-08 NOTE — Progress Notes (Addendum)
Error

## 2015-06-08 NOTE — Consult Note (Signed)
[facility] Renal Consultation Note  Requesting MD: [place] Indication for Consultation: Advanced CKD and hyperkalemia  HPI:  [name] is a 74 y.o. male with past medical history significant for hypertension, atrial fibrillation, history of PE on Coumadin. He also has a history of bladder cancer and acquired renal cysts with some BPH issues. In addition, he has advanced chronic kidney disease followed by Dr. Mercy Moore at [facility]. Dialysis has been discussed and patient is of the opinion that he would not want to do dialysis under any circumstances understanding that the consequence of that could be death. This was reaffirmed during an appointment approximately one month ago. Patient was in his usual state of health when he developed back and side pain today. He was noted to be hypotensive and imaging revealed a large hematoma involving the right kidney with extension into the period nephric space and retroperitoneum. INR 2.78 An hemoglobin of 6.3. Status post transfusion of 2 units of packed RBCs and due for a third. Blood pressure is back up. He is hypothermic. He states that he has not voided any at all today. Apparently, a urinary catheter was ordered but they were unable to place. Urology is also been consulted for this hematoma. They are in favor of observation at this time. Patient is in some distress due to pain but otherwise alert and oriented. Family at bedside. They all still reconfirm his decision to not pursue dialysis under any circumstances. Creatinine is at 10 and potassium is at 5.2. Currently taking the Veltassa down to the pharmacy so they can administer it.  CREATININE, SER  Date/Time Value Ref Range Status  05/20/2015 09:50 AM 10.63* 0.61 - 1.24 mg/dL Final  12/17/2008 04:05 AM 6.11* 0.4 - 1.5 mg/dL Final  12/15/2008 04:00 AM 6.40* 0.4 - 1.5 mg/dL Final  12/14/2008 06:08 AM 6.10* 0.4 - 1.5 mg/dL Final  12/13/2008 06:27 AM 6.24* 0.4 - 1.5 mg/dL  Final  12/12/2008 06:10 AM 6.34* 0.4 - 1.5 mg/dL Final  12/11/2008 06:38 AM 6.29* 0.4 - 1.5 mg/dL Final  12/10/2008 04:45 PM 6.21* 0.4 - 1.5 mg/dL Final  12/05/2008 05:12 AM 5.57* 0.4 - 1.5 mg/dL Final  12/04/2008 04:10 AM 5.66* 0.4 - 1.5 mg/dL Final  12/03/2008 05:10 AM 5.82* 0.4 - 1.5 mg/dL Final  12/02/2008 05:30 AM 5.95* 0.4 - 1.5 mg/dL Final  12/01/2008 06:25 AM 6.57* 0.4 - 1.5 mg/dL Final  12/01/2008 06:25 AM 6.37* 0.4 - 1.5 mg/dL Final  11/30/2008 11:12 PM 6.13* 0.4 - 1.5 mg/dL Final     PMHx:   Past Medical History  Diagnosis Date  . A-fib   . PE (pulmonary embolism)   . Chronic renal insufficiency, stage IV (severe)   . Bladder cancer   . Renal cyst, acquired   . Elevated PSA   . Deep venous thrombosis   . WPW (Wolff-Parkinson-White syndrome)   . Renal hematoma, left 2010  . BPH (benign prostatic hypertrophy) with urinary obstruction     Past Surgical History  Procedure Laterality Date  . Transurethral needle ablation of the prostate  2001  . Hernia repair      Family Hx:  Family History  Problem Relation Age of Onset  . Cancer Father     Social History:  reports that he has quit smoking. He does not have any smokeless tobacco history on file. He reports that he does not drink alcohol. His drug history is not on file.  Allergies: No Known Allergies  Medications: Prior  to Admission medications   Medication Sig Start Date End Date Taking? Authorizing Provider  calcitRIOL (ROCALTROL) 0.25 MCG capsule Take 0.25 mcg by mouth daily. 06/06/15   Historical Provider, MD  Casanthranol-Docusate Sodium 30-100 MG CAPS Take 1 tablet by mouth at bedtime.    Historical Provider, MD  diltiazem (CARDIZEM CD) 300 MG 24 hr capsule Take 300 mg by mouth Daily.  08/22/12   Historical Provider, MD  fluticasone (FLONASE) 50 MCG/ACT nasal spray Place 2 sprays into both nostrils daily as needed for allergies.  08/22/12   Historical Provider, MD  furosemide (LASIX) 80 MG tablet Take 80  mg by mouth daily. 05/21/15   Historical Provider, MD  Layla Barter 15 GM/60ML suspension Take 30 g by mouth daily.  08/17/12   Historical Provider, MD  metoprolol (TOPROL-XL) 200 MG 24 hr tablet Take 200 mg by mouth Daily.  08/22/12   Historical Provider, MD  NON FORMULARY sensipar one tablet every other day    Historical Provider, MD  patiromer Daryll Drown) 8.4 G packet Take 8.4 g by mouth daily.    Historical Provider, MD  Sevelamer Carbonate (RENVELA PO) Take 1 tablet by mouth daily.    Historical Provider, MD  warfarin (COUMADIN) 2.5 MG tablet Take as directed by Coumadin clinic 11/19/14   Minus Breeding, MD    I have reviewed the patient's current medications.  Labs:  Results for orders placed or performed during the hospital encounter of 06/05/2015 (from the past 48 hour(s))  Prepare fresh frozen plasma     Status: None   Collection Time: 05/31/2015  9:44 AM  Result Value Ref Range   Unit Number [id number]    Blood Component Type LIQ PLASMA    Unit division 00    Status of Unit REL FROM [facility]    Unit tag comment VERBAL ORDERS PER DR RANCOUR    Transfusion Status OK TO TRANSFUSE    Unit Number [id number]    Blood Component Type LIQ PLASMA    Unit division 00    Status of Unit REL FROM [facility]    Unit tag comment VERBAL ORDERS PER DR RANCOUR    Transfusion Status OK TO TRANSFUSE   Comprehensive metabolic panel     Status: Abnormal   Collection Time: 06/07/2015  9:50 AM  Result Value Ref Range   Sodium 142 135 - 145 mmol/L   Potassium 5.2 (H) 3.5 - 5.1 mmol/L   Chloride 117 (H) 101 - 111 mmol/L   CO2 12 (L) 22 - 32 mmol/L   Glucose, Bld 180 (H) 65 - 99 mg/dL   BUN 98 (H) 6 - 20 mg/dL   Creatinine, Ser 10.63 (H) 0.61 - 1.24 mg/dL   Calcium 9.3 8.9 - 10.3 mg/dL   Total Protein 5.7 (L) 6.5 - 8.1 g/dL   Albumin 2.4 (L) 3.5 - 5.0 g/dL   AST 12 (L) 15 - 41 U/L   ALT 9 (L) 17 - 63 U/L   Alkaline Phosphatase 50 38 - 126 U/L   Total Bilirubin 0.6 0.3 - 1.2 mg/dL   GFR calc non Af Amer 4 (L)  >60 mL/min   GFR calc Af Amer 5 (L) >60 mL/min    Comment: (NOTE) The eGFR has been calculated using the CKD EPI equation. This calculation has not been validated in all clinical situations. eGFR's persistently <60 mL/min signify possible Chronic Kidney Disease.    Anion gap 13 5 - 15  CBC     Status: Abnormal   Collection  Time: 05/27/2015  9:50 AM  Result Value Ref Range   WBC 13.4 (H) 4.0 - 10.5 K/uL   RBC 2.28 (L) 4.22 - 5.81 MIL/uL   Hemoglobin 7.0 (L) 13.0 - 17.0 g/dL   HCT 21.6 (L) 39.0 - 52.0 %   MCV 94.7 78.0 - 100.0 fL   MCH 30.7 26.0 - 34.0 pg   MCHC 32.4 30.0 - 36.0 g/dL   RDW 15.5 11.5 - 15.5 %   Platelets 250 150 - 400 K/uL  Protime-INR - (order if Patient is taking Coumadin / Warfarin)     Status: Abnormal   Collection Time: 05/26/2015  9:50 AM  Result Value Ref Range   Prothrombin Time 28.9 (H) 11.6 - 15.2 seconds   INR 2.78 (H) 0.00 - 1.49  Troponin I     Status: None   Collection Time: 05/19/2015  9:52 AM  Result Value Ref Range   Troponin I <0.03 <0.031 ng/mL    Comment:        NO INDICATION OF MYOCARDIAL INJURY.   Type and screen     Status: None (Preliminary result)   Collection Time: 05/31/2015  9:55 AM  Result Value Ref Range   ABO/RH(D) A POS    Antibody Screen NEG    Sample Expiration July 02, 2015    Unit Number [id number]    Blood Component Type RED CELLS,LR    Unit division 00    Status of Unit ISSUED    Unit tag comment VERBAL ORDERS PER DR RANCOUR    Transfusion Status OK TO TRANSFUSE    Crossmatch Result COMPATIBLE    Unit Number [id number]    Blood Component Type RBC LR PHER1    Unit division 00    Status of Unit ISSUED    Unit tag comment VERBAL ORDERS PER DR RANCOUR    Transfusion Status OK TO TRANSFUSE    Crossmatch Result COMPATIBLE    Unit Number [id number]    Blood Component Type RED CELLS,LR    Unit division 00    Status of Unit ALLOCATED    Transfusion Status OK TO TRANSFUSE    Crossmatch Result Compatible   Prepare RBC      Status: None   Collection Time: 05/16/2015  9:56 AM  Result Value Ref Range   Order Confirmation ORDER PROCESSED BY BLOOD BANK   I-Stat CG4 Lactic Acid, ED     Status: Abnormal   Collection Time: 05/28/2015  9:57 AM  Result Value Ref Range   Lactic Acid, Venous 2.25 (HH) 0.5 - 2.0 mmol/L   Comment NOTIFIED PHYSICIAN   POC occult blood, ED     Status: None   Collection Time: 05/30/2015 10:55 AM  Result Value Ref Range   Fecal Occult Bld NEGATIVE NEGATIVE  Prepare fresh frozen plasma     Status: None (Preliminary result)   Collection Time: 05/22/2015 11:34 AM  Result Value Ref Range   Unit Number [id number]    Blood Component Type THAWED PLASMA    Unit division 00    Status of Unit ISSUED    Transfusion Status OK TO TRANSFUSE    Unit Number [id number]    Blood Component Type THAWED PLASMA    Unit division 00    Status of Unit ISSUED    Transfusion Status OK TO TRANSFUSE   CBC WITH DIFFERENTIAL     Status: Abnormal   Collection Time: 06/12/2015  3:33 PM  Result Value Ref Range   WBC 11.6 (H) 4.0 - 10.5  K/uL   RBC 2.12 (L) 4.22 - 5.81 MIL/uL   Hemoglobin 6.3 (LL) 13.0 - 17.0 g/dL    Comment: REPEATED TO VERIFY CRITICAL RESULT CALLED TO, READ BACK BY AND VERIFIED WITH: B GROGAN,RN AT 1610 06/09/2015 BY K BARR    HCT 19.1 (L) 39.0 - 52.0 %   MCV 90.1 78.0 - 100.0 fL   MCH 29.7 26.0 - 34.0 pg   MCHC 33.0 30.0 - 36.0 g/dL   RDW 16.0 (H) 11.5 - 15.5 %   Platelets 163 150 - 400 K/uL   Neutrophils Relative % 88 (H) 43 - 77 %   Neutro Abs 10.1 (H) 1.7 - 7.7 K/uL   Lymphocytes Relative 8 (L) 12 - 46 %   Lymphs Abs 0.9 0.7 - 4.0 K/uL   Monocytes Relative 4 3 - 12 %   Monocytes Absolute 0.5 0.1 - 1.0 K/uL   Eosinophils Relative 0 0 - 5 %   Eosinophils Absolute 0.0 0.0 - 0.7 K/uL   Basophils Relative 0 0 - 1 %   Basophils Absolute 0.0 0.0 - 0.1 K/uL  Prepare RBC     Status: None   Collection Time: 06/07/2015  4:43 PM  Result Value Ref Range   Order Confirmation ORDER PROCESSED BY  BLOOD BANK      ROS:  A comprehensive review of systems was negative except for: Gastrointestinal: positive for abdominal pain  Physical Exam: Filed Vitals:   06/01/2015 1630  BP:   Pulse:   Temp: 92.5 F (33.6 C)  Resp:      General: Alert. Complaining of pain on his side otherwise appears comfortable  HEENT: Pupils are equal round reactive to light, extra ocular motions are intact, mucous membranes are moist  Neck: Mild JVD  Heart: Regular rate and rhythm without murmur, gallop, or rub  Lungs: Decreased breath sounds at the bases  Abdomen: Distended, diffusely tender  Extremities: Trace to 1+ edema bilaterally  Skin: Pale and dry  Neuro: Alert and oriented. The remainder of the neurologic exam is nonfocal  Assessment/Plan: 74 year old white male with history of advanced CK D deciding not to pursue dialysis who now presents with a large right-sided hematoma with a hemoglobin of 6.3 and hemodynamic instability  1.Renal- advanced CKD which is not new. Creatinine was 9.5 a month ago. Patient reaffirms his decision to not pursue dialysis under any circumstances. This decision will be honored.  He tells me he's not had any urine output for the day. He could have some ATN based on his initial hypotension. His volume has been resuscitated so I think we will need to see if he voids on his own now with volume repletion and I will add some Lasix. Initial attempt at urinary catheter placement was unsuccessful. We will hold off for now as not to incite any additional trauma into the situation.  2.Hematoma  - causing anemia and hypotension. Patient is receiving blood. Surgery has been consult and and opts for conservative management right now. Hopefully bleeding will stop  3. Hyperkalemia - is not too significant at this time. He will continue his home dose of the veltassa daily - also lasix and sodium bicarb may help 4. Anemia  - due to acute blood loss. Is on Aranesp normally as outpatient. In  addition to blood this will be added 5. Metabolic acidosis- will add sodium bicarbonate 6. Volume- only appears to be slightly volume overloaded at this time. Getting IV fluids at 100/h. We'll minimize maintenance IV fluids  but is due to get another unit of blood. Normally on Lasix 80 daily. Will add low-dose here now because blood pressure is up and to hopefully help to induce some urine output.   Thank you for this consultation. We will continue to follow patient with you  Shaneen Reeser A 06/09/2015, 5:08 PM

## 2015-06-08 NOTE — H&P (Signed)
PCP:   PROVIDER NOT IN SYSTEM   Chief Complaint:  Right flank pain  HPI: 74 year old male who   has a past medical history of A-fib and PE (pulmonary embolism). today came to the ED after patient had right-sided lower back pain and abdominal swelling which started this morning. Patient also had a near-syncopal episode while he was getting to the EMS stretcher where he lost pulse. CPR was not performed. At that time patient was  hypotensive with blood pressure in 70/40. In the ED CT scan of the abdomen and pelvis was done which showed large hematoma involving the right kidney with extension into the perinephric space, retroperitoneum and the peritoneal space as there is moderate hemoperitoneum. Ruptured hemorrhagic cyst in the right kidney may be the source of hemorrhage. Patient has C KD stage IV today BUN/creatinine is 98/10.63, previous BUN/creatinine from 2010 shows 74/6.11. Patient has refused hemodialysis in the past and is again refusing today. He is followed by nephrology as outpatient. In the ED patient received 2 units of blood transfusion and is currently receiving fresh frozen plasma  Allergies:  No Known Allergies    Past Medical History  Diagnosis Date  . A-fib   . PE (pulmonary embolism)     No past surgical history on file.  Prior to Admission medications   Medication Sig Start Date End Date Taking? Authorizing Provider  calcitRIOL (ROCALTROL) 0.25 MCG capsule Take 0.25 mcg by mouth daily. 06/06/15   Historical Provider, MD  Casanthranol-Docusate Sodium 30-100 MG CAPS Take 1 tablet by mouth at bedtime.    Historical Provider, MD  diltiazem (CARDIZEM CD) 300 MG 24 hr capsule Take 300 mg by mouth Daily.  08/22/12   Historical Provider, MD  fluticasone (FLONASE) 50 MCG/ACT nasal spray Place 2 sprays into both nostrils daily as needed for allergies.  08/22/12   Historical Provider, MD  furosemide (LASIX) 80 MG tablet Take 80 mg by mouth daily. 05/21/15   Historical Provider, MD   Layla Barter 15 GM/60ML suspension Take 30 g by mouth daily.  08/17/12   Historical Provider, MD  metoprolol (TOPROL-XL) 200 MG 24 hr tablet Take 200 mg by mouth Daily.  08/22/12   Historical Provider, MD  NON FORMULARY sensipar one tablet every other day    Historical Provider, MD  patiromer Daryll Drown) 8.4 G packet Take 8.4 g by mouth daily.    Historical Provider, MD  Sevelamer Carbonate (RENVELA PO) Take 1 tablet by mouth daily.    Historical Provider, MD  warfarin (COUMADIN) 2.5 MG tablet Take as directed by Coumadin clinic 11/19/14   Minus Breeding, MD    Social History:  has no tobacco, alcohol, and drug history on file.  No family history on file.  There were no vitals filed for this visit.  All the positives are listed in BOLD  Review of Systems:  HEENT: Headache, blurred vision, runny nose, sore throat Neck: Hypothyroidism, hyperthyroidism,,lymphadenopathy Chest : Shortness of breath, history of COPD, Asthma Heart : Chest pain, history of coronary arterey disease GI:  Nausea, vomiting, diarrhea, constipation, GERD GU: Dysuria, urgency, frequency of urination, hematuria Neuro: Stroke, seizures, near -syncope Psych: Depression, anxiety, hallucinations   Physical Exam: Blood pressure 114/65, pulse 67, temperature 98.8 F (37.1 C), temperature source Oral, resp. rate 20, SpO2 99 %. Constitutional:   Patient is a well-developed and well-nourished male  in no acute distress and cooperative with exam. Head: Normocephalic and atraumatic Mouth: Mucus membranes moist Eyes: PERRL, EOMI, conjunctivae normal Neck: Supple,  No Thyromegaly Cardiovascular: RRR, S1 normal, S2 normal Pulmonary/Chest: CTAB, no wheezes, rales, or rhonchi Abdominal: Soft. Right lower quadrant tenderness to palpation, distended, bowel sounds are normal, no masses, organomegaly, or guarding present.  Neurological: A&O x 3, Strength is normal and symmetric bilaterally, cranial nerve II-XII are grossly intact, no focal  motor deficit, sensory intact to light touch bilaterally.  Extremities : No Cyanosis, Clubbing or Edema  Labs on Admission:  Basic Metabolic Panel:  Recent Labs Lab 06/07/2015 0950  NA 142  K 5.2*  CL 117*  CO2 12*  GLUCOSE 180*  BUN 98*  CREATININE 10.63*  CALCIUM 9.3   Liver Function Tests:  Recent Labs Lab 06/13/2015 0950  AST 12*  ALT 9*  ALKPHOS 50  BILITOT 0.6  PROT 5.7*  ALBUMIN 2.4*   No results for input(s): LIPASE, AMYLASE in the last 168 hours. No results for input(s): AMMONIA in the last 168 hours. CBC:  Recent Labs Lab 05/22/2015 0950  WBC 13.4*  HGB 7.0*  HCT 21.6*  MCV 94.7  PLT 250   Cardiac Enzymes:  Recent Labs Lab 06/07/2015 0952  TROPONINI <0.03    BNP (last 3 results) No results for input(s): BNP in the last 8760 hours.  ProBNP (last 3 results) No results for input(s): PROBNP in the last 8760 hours.  CBG: No results for input(s): GLUCAP in the last 168 hours.  Radiological Exams on Admission: Ct Abdomen Pelvis Wo Contrast  06/09/2015   CLINICAL DATA:  74 year old on long-term anticoagulation for atrial fibrillation and prior pulmonary emboli, presenting to the emergency department with abdominal distention and hypotension. Evaluate for intra-abdominal bleeding. Current history of stage 4 chronic kidney disease. Remote personal history of bladder cancer. Personal history of left perinephric hematoma in 2010.  EXAM: CT ABDOMEN AND PELVIS WITHOUT CONTRAST  TECHNIQUE: Multidetector CT imaging of the abdomen and pelvis was performed following the standard protocol without IV contrast.  COMPARISON:  08/11/2010 dating back to 11/30/2008.  FINDINGS: Hepatobiliary: Liver normal in size and appearance for the unenhanced technique. Normal-appearing gallbladder without calcified gallstones. No biliary ductal dilation.  Spleen:  Normal in size and appearance.  Pancreas:  Normal in appearance.  No pancreatic ductal dilation.  Adrenal glands:  Normal in  appearance.  Genitourinary: Large hematoma involving the right kidney extending into the perinephric space, the retroperitoneum, the root of the mesentery, into the peritoneal cavity with moderate hemoperitoneum and in the right upper quadrant below the liver surrounding the gallbladder and in the paracolic gutters bilaterally.  Numerous cysts involving both kidneys, many of which appear hemorrhagic, significantly increased in number since the prior CTs. Hemorrhagic cysts arising from the anterior and posterior portion of the mid left kidney measure approximately 5 cm and 7 cm respectively. A solid renal mass is difficult to exclude on the unenhanced study, but no discrete solid mass is identified in either kidney. No evidence of urinary tract calculi on either side. Extensive arterial calcification involving the polar branches of both kidneys. Urinary bladder unremarkable.  Marked prostate gland enlargement, maximum measurements approximating 4.5 x 5.6 x 4.8 cm. Normal seminal vesicles.  Gastrointestinal: Stomach normal in appearance for degree of distention. Normal-appearing small bowel. Scattered sigmoid colon diverticula without evidence of acute diverticulitis. Remainder the colon decompressed and unremarkable. Normal appendix in the right upper pelvis, identified medial to the right perinephric hematoma.  Ascites:  Moderate hemoperitoneum.  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 Visceral artery atherosclerosis. IVC filter in the infrarenal IVC. IVC decompressed consistent  with hypotension. Right femoral central venous catheter tip in the right external iliac vein.  Lymphatic:  No pathologic lymphadenopathy in the abdomen or pelvis.  Other findings: None.  Musculoskeletal: Spondylosis involving the lower thoracic and upper lumbar spine. Numerous Schmorl's nodes. Facet degenerative changes involving the lower lumbar spine.  Visualized lower thorax: Heart enlarged likely with left  ventricular hypertrophy. Severe aortic valvular calcification. Right coronary artery atherosclerosis. No pericardial effusion. Visualized lung bases clear. Very small right pleural effusion.  IMPRESSION: 1. Large hematoma involving the right kidney with extension into the perinephric space, the retroperitoneum, and the peritoneal space as there is moderate hemoperitoneum. 2. Innumerable cysts involving both kidneys, many of which are hemorrhagic. A ruptured hemorrhagic cyst from the right kidney may be the source of the hemorrhage. There is no other identifiable source for the hemorrhage. 3. Stable marked prostate gland enlargement dating back to 2011. 4. Very small right pleural effusion. 5. Right femoral central venous catheter tip in the right external iliac vein. I telephoned this critical value/emergency results at the time of interpretation on 06/02/2015 at 11:05 am to Dr. Ezequiel Essex, who verbally acknowledged these results.   Electronically Signed   By: Evangeline Dakin M.D.   On: 06/10/2015 11:09   Dg Chest Portable 1 View  06/02/2015   CLINICAL DATA:  Weakness. History of atrial fibrillation and pulmonary embolus.  EXAM: PORTABLE CHEST - 1 VIEW  COMPARISON:  11/15/2011  FINDINGS: The cardiac silhouette is upper limits of normal in size. The patient has taken a shallower inspiration than on the prior study with mild crowding of the bronchovascular markings centrally. No confluent airspace opacity, overt pulmonary edema, pleural effusion, or pneumothorax is identified, although the far lateral aspect of the right lung base was incompletely imaged. Numerous monitoring leads overlie the chest. Thoracolumbar spondylosis is noted.  IMPRESSION: No active disease.   Electronically Signed   By: Logan Bores   On: 05/24/2015 11:02    EKG: Independently reviewed. Atrial fibrillation   Assessment/Plan Active Problems:   Atrial fibrillation   Long term current use of anticoagulant therapy   Renal  hematoma   Renal hemorrhage, right   Atrial fibrillation   Acute kidney injury on C KD stage IV  Renal hematoma Patient has large renal hematoma with extension to the retroperitoneum, peritoneal space and moderate hemoperitoneum. General surgery and urology have been consulted by the ED physician, and they recommend conservative management this time with IV fluids. They will see the patient as consult.   Acute kidney injury on C KD stage IV Patient has refused hemodialysis . Will continue gentle IV hydration and check renal functions in a.m.  Hyperkalemia Patient takes Kayexalate daily at home. Will continue daily Kayexalate 30 g.  Atrial fibrillation Patient's EKG shows A. Fib and heart rate is controlled. Anticoagulation is on hold due to bleeding.  [place] toxicity Patient INR is 2.78, received fresh frozen plasma in the ED. One dose of vitamin k 5 mg x 1 Will check PT/INR in a.m.  Anemia Patient came with anemia received 2 units of PRBC. We'll check CBC today after blood transfusion.    Code status: DO NOT RESUSCITATE  Family discussion: Admission, patients condition and plan of care including tests being ordered have been discussed with the patient and his wife and daughter at bedside who indicate understanding and agree with the plan and Code Status.   Time Spent on Admission: 60 min  [place] [facility] Pager: [phone] 05/20/2015, 12:39  PM  If 7PM-7AM, please contact night-coverage  [email or web address]  Password [email or web address]

## 2015-06-08 NOTE — ED Notes (Addendum)
Pt resting quietly at the time. Denies pain. Remains afib on cardiac monitor. Vital signs stable. Pt is alert and oriented x4.

## 2015-06-08 NOTE — ED Notes (Signed)
Attempted foley catheter x2, unable. Pt reports issues with catheter insertion in the past. EDP notified.

## 2015-06-08 NOTE — Progress Notes (Signed)
Chaplain responded to page from ER Secretary that family was present for patient in Trauma B.  Chaplain escorted family to family room and assisted with MD consult.  Sat with wife and grand-daughter while CT was performed and until family could visit patient.  Prayer, spiritual conversation.  (wife of patient is a Company secretary in [employer].)  Will check on patient throughout day.  Rev. [place], [place]

## 2015-06-08 NOTE — ED Notes (Signed)
FAST exam performed by Dr. Wyvonnia Dusky and reported no free fluid.

## 2015-06-08 NOTE — Consult Note (Signed)
Subjective: I was asked to see Mr. Illes in consultation for a right renal hematoma.  He had the onset earlier today of severe right abdominal pain with distention.  He had a syncopal episode when EMS was picking him up and was pulseless briefly.   He was found to be hypotensive and anemic with a Hgb of 7 in the ER and a CT shows a large right renal hematoma.  He is on warfarin for a-fib and a history of DVT/PE.  His INR is 2.87.  He continues to have pain but denies nausea or hematuria.  He had a left renal hematoma in 2010 that was managed conservatively.  He has a putative history of bladder cancer remotely but I have not been able to find the records for that.  He is a former patient of Dr. Tommy Medal and had a TUNA of the prostate in 2001.  He has no voiding difficulty.  He has a history of bilateral renal cysts and CRI. His Cr was 6 in 2010.  He is followed by Dr. Mercy Moore.   ROS:  Review of Systems  Constitutional: Negative for fever and chills.  HENT: Negative.   Eyes: Negative.   Respiratory: Negative for cough and shortness of breath.   Cardiovascular: Positive for leg swelling. Negative for chest pain and palpitations.  Gastrointestinal: Positive for abdominal pain (in the right upper quadrant with abdominal distention). Negative for heartburn, diarrhea, constipation and blood in stool.  Genitourinary: Negative.   Musculoskeletal: Negative.   Skin: Negative.   Neurological:       Syncopal spell this am.  Endo/Heme/Allergies: Negative.   Psychiatric/Behavioral: Negative.     No Known Allergies  Past Medical History  Diagnosis Date  . A-fib   . PE (pulmonary embolism)   . Chronic renal insufficiency, stage IV (severe)   . Bladder cancer   . Renal cyst, acquired   . Elevated PSA   . Deep venous thrombosis   . WPW (Wolff-Parkinson-White syndrome)   . Renal hematoma, left 2010  . BPH (benign prostatic hypertrophy) with urinary obstruction     Past Surgical History   Procedure Laterality Date  . Transurethral needle ablation of the prostate  2001  . Hernia repair      History   Social History  . Marital Status: Married    Spouse Name: N/A  . Number of Children: N/A  . Years of Education: N/A   Occupational History  . Not on file.   Social History Main Topics  . Smoking status: Former Research scientist (life sciences)  . Smokeless tobacco: Not on file  . Alcohol Use: No  . Drug Use: Not on file  . Sexual Activity: Not on file   Other Topics Concern  . Not on file   Social History Narrative  . No narrative on file    Family History  Problem Relation Age of Onset  . Cancer Father     Anti-infectives: Anti-infectives    None      Current Facility-Administered Medications  Medication Dose Route Frequency Provider Last Rate Last Dose  . 0.9 %  sodium chloride infusion   Intravenous STAT Ezequiel Essex, MD 100 mL/hr at 05/23/2015 1222    . 0.9 %  sodium chloride infusion   Intravenous Continuous Oswald Hillock, MD 100 mL/hr at 06/03/2015 1456    . acetaminophen (TYLENOL) tablet 650 mg  650 mg Oral Q6H PRN Oswald Hillock, MD       Or  . acetaminophen (  TYLENOL) suppository 650 mg  650 mg Rectal Q6H PRN Oswald Hillock, MD      . ondansetron (ZOFRAN) tablet 4 mg  4 mg Oral Q6H PRN Oswald Hillock, MD       Or  . ondansetron (ZOFRAN) injection 4 mg  4 mg Intravenous Q6H PRN Oswald Hillock, MD      . phytonadione (VITAMIN K) tablet 5 mg  5 mg Oral Once Ezequiel Essex, MD      . sodium polystyrene (KAYEXALATE) 15 GM/60ML suspension 30 g  30 g Oral Daily Oswald Hillock, MD         Objective: Vital signs in last 24 hours: Temp:  [92.1 F (33.4 C)-98.8 F (37.1 C)] 92.1 F (33.4 C) (07/24 1454) Pulse Rate:  [67-90] 73 (07/24 1338) Resp:  [19-21] 19 (07/24 1338) BP: (60-142)/(38-84) 142/66 mmHg (07/24 1338) SpO2:  [99 %-100 %] 100 % (07/24 1338)  Intake/Output from previous day:   Intake/Output this shift: Total I/O In: 5518 [I.V.:4300; Blood:609; Other:609] Out: -     Physical Exam  Constitutional: He is oriented to person, place, and time and well-developed, well-nourished, and in no distress.  HENT:  Head: Normocephalic and atraumatic.  Neck: Normal range of motion. Neck supple. No JVD present. No thyromegaly present.  Cardiovascular: Normal rate, regular rhythm and normal heart sounds.   Pulmonary/Chest: Effort normal and breath sounds normal. No respiratory distress. He has no wheezes. He has no rales.  Abdominal: Soft. He exhibits distension. There is tenderness (right upper quadrant is mod/severe with some diffuse tenderness. ). There is rebound and guarding.  Neurological: He is alert and oriented to person, place, and time.  Skin:  He is very cool and clammy  Psychiatric: Mood and affect normal.  Vitals reviewed.   Lab Results:   Recent Labs  05/21/2015 0950  WBC 13.4*  HGB 7.0*  HCT 21.6*  PLT 250   BMET  Recent Labs  05/25/2015 0950  NA 142  K 5.2*  CL 117*  CO2 12*  GLUCOSE 180*  BUN 98*  CREATININE 10.63*  CALCIUM 9.3   PT/INR  Recent Labs  06/06/15 1221 05/30/2015 0950  LABPROT  --  28.9*  INR 3.2 2.78*   ABG No results for input(s): PHART, HCO3 in the last 72 hours.  Invalid input(s): PCO2, PO2  Studies/Results: Ct Abdomen Pelvis Wo Contrast  06/10/2015   CLINICAL DATA:  74 year old on long-term anticoagulation for atrial fibrillation and prior pulmonary emboli, presenting to the emergency department with abdominal distention and hypotension. Evaluate for intra-abdominal bleeding. Current history of stage 4 chronic kidney disease. Remote personal history of bladder cancer. Personal history of left perinephric hematoma in 2010.  EXAM: CT ABDOMEN AND PELVIS WITHOUT CONTRAST  TECHNIQUE: Multidetector CT imaging of the abdomen and pelvis was performed following the standard protocol without IV contrast.  COMPARISON:  08/11/2010 dating back to 11/30/2008.  FINDINGS: Hepatobiliary: Liver normal in size and appearance  for the unenhanced technique. Normal-appearing gallbladder without calcified gallstones. No biliary ductal dilation.  Spleen:  Normal in size and appearance.  Pancreas:  Normal in appearance.  No pancreatic ductal dilation.  Adrenal glands:  Normal in appearance.  Genitourinary: Large hematoma involving the right kidney extending into the perinephric space, the retroperitoneum, the root of the mesentery, into the peritoneal cavity with moderate hemoperitoneum and in the right upper quadrant below the liver surrounding the gallbladder and in the paracolic gutters bilaterally.  Numerous cysts involving both kidneys, many  of which appear hemorrhagic, significantly increased in number since the prior CTs. Hemorrhagic cysts arising from the anterior and posterior portion of the mid left kidney measure approximately 5 cm and 7 cm respectively. A solid renal mass is difficult to exclude on the unenhanced study, but no discrete solid mass is identified in either kidney. No evidence of urinary tract calculi on either side. Extensive arterial calcification involving the polar branches of both kidneys. Urinary bladder unremarkable.  Marked prostate gland enlargement, maximum measurements approximating 4.5 x 5.6 x 4.8 cm. Normal seminal vesicles.  Gastrointestinal: Stomach normal in appearance for degree of distention. Normal-appearing small bowel. Scattered sigmoid colon diverticula without evidence of acute diverticulitis. Remainder the colon decompressed and unremarkable. Normal appendix in the right upper pelvis, identified medial to the right perinephric hematoma.  Ascites:  Moderate hemoperitoneum.  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 Visceral artery atherosclerosis. IVC filter in the infrarenal IVC. IVC decompressed consistent with hypotension. Right femoral central venous catheter tip in the right external iliac vein.  Lymphatic:  No pathologic lymphadenopathy in the abdomen or  pelvis.  Other findings: None.  Musculoskeletal: Spondylosis involving the lower thoracic and upper lumbar spine. Numerous Schmorl's nodes. Facet degenerative changes involving the lower lumbar spine.  Visualized lower thorax: Heart enlarged likely with left ventricular hypertrophy. Severe aortic valvular calcification. Right coronary artery atherosclerosis. No pericardial effusion. Visualized lung bases clear. Very small right pleural effusion.  IMPRESSION: 1. Large hematoma involving the right kidney with extension into the perinephric space, the retroperitoneum, and the peritoneal space as there is moderate hemoperitoneum. 2. Innumerable cysts involving both kidneys, many of which are hemorrhagic. A ruptured hemorrhagic cyst from the right kidney may be the source of the hemorrhage. There is no other identifiable source for the hemorrhage. 3. Stable marked prostate gland enlargement dating back to 2011. 4. Very small right pleural effusion. 5. Right femoral central venous catheter tip in the right external iliac vein. I telephoned this critical value/emergency results at the time of interpretation on 06/10/2015 at 11:05 am to Dr. Ezequiel Essex, who verbally acknowledged these results.   Electronically Signed   By: Evangeline Dakin M.D.   On: 06/03/2015 11:09   Dg Chest Portable 1 View  05/29/2015   CLINICAL DATA:  Weakness. History of atrial fibrillation and pulmonary embolus.  EXAM: PORTABLE CHEST - 1 VIEW  COMPARISON:  11/15/2011  FINDINGS: The cardiac silhouette is upper limits of normal in size. The patient has taken a shallower inspiration than on the prior study with mild crowding of the bronchovascular markings centrally. No confluent airspace opacity, overt pulmonary edema, pleural effusion, or pneumothorax is identified, although the far lateral aspect of the right lung base was incompletely imaged. Numerous monitoring leads overlie the chest. Thoracolumbar spondylosis is noted.  IMPRESSION: No  active disease.   Electronically Signed   By: Logan Bores   On: 05/28/2015 11:02   I discussed the case with his ER physician and reviewed the CT films and report and current labs.  I have reviewed his med list but it hadn't been reconciled by pharmacy yet.    I  Have reviewed our office notes from his last visit with Dr. Serita Butcher in 2011  Assessment: Renal hemorrhage, right He has an acute right renal hemorrhage possibly from one of the multiple cysts aggravated by anticoagulation.   He has CRI and his Cr is up to 10.   He has a history of BPH with BOO and possibly prior bladder cancer.  He is hemodynamically stable at this time and with his CRI not on dialysis and elevated INR, it would be best to avoid active intervention with embolization and manage supportively with blood and fluids and reversal of anticoagulation.    I will continue to follow.        CC: Dr. Arelia Sneddon     Malka So 06/03/2015

## 2015-06-08 NOTE — Progress Notes (Signed)
CRITICAL VALUE ALERT  Critical value received:  Hg 6.3  Date of notification:  06/03/2015   Time of notification:  1611  Critical value read back:Yes.    Nurse who received alert:  Austin Miles   MD notified (1st page):  Darrick Meigs, G  Time of first page:  1611  MD notified (2nd page):  Time of second page:  Responding MD:  Georgiann Mohs  Time MD responded:  [phone]

## 2015-06-08 NOTE — ED Notes (Signed)
1st unit of PRBCs started on rapid infuser

## 2015-06-08 NOTE — ED Notes (Signed)
Right femoral central line being placed by Dr. Wyvonnia Dusky.

## 2015-06-08 NOTE — Progress Notes (Signed)
Patient refusing Kayexalate requesting to use experimental medication from home.  Educated patient on importance of medication due to elevated potassium patient continues to refuse.  Physician notified will continue to follow up.

## 2015-06-08 NOTE — ED Notes (Signed)
2nd unit of prbcs started via rapid infuser.

## 2015-06-08 NOTE — ED Notes (Signed)
Pt resting quietly at the time. Vital signs stable. FFP infusing at present. Family at bedside. Pt to be admitted.

## 2015-06-08 NOTE — ED Notes (Signed)
2nd unit prbcs finished, no reaction.

## 2015-06-08 NOTE — ED Notes (Signed)
Patient transported to CT via RN and monitor and NT

## 2015-06-08 NOTE — ED Notes (Signed)
Attempted foley catheter. Attempt unsuccessful. Pt reported issues with foley in the past.

## 2015-06-08 NOTE — ED Notes (Signed)
Attempted to call report, Nurse to call back

## 2015-06-08 NOTE — Progress Notes (Signed)
Bair Hugger initiated.

## 2015-06-08 NOTE — Assessment & Plan Note (Addendum)
He has an acute right renal hemorrhage possibly from one of the multiple cysts aggravated by anticoagulation.  He is feeling better today with reduced pain and his BP has improved with hydration and 2 unit transfusion but his Hgb has drifted down to 5.7 from 6.3.   His Cr is stable.  He is voiding comfortably.  INR down to 2.28.  Continue current care.  No need for IR or surgical intervention at this time.    I will continue to follow.

## 2015-06-08 NOTE — ED Notes (Signed)
Epi gtt from EMS discontinued

## 2015-06-08 NOTE — ED Notes (Addendum)
Pt arrived via [facility] with right sided back pain and has right sided abdominal swelling.  Pt had syncopal episode when moving patient to stretcher and unable to feel pulses, no CPR initiated.  BP dropped to 70/43.  Pt is on coumadin with history of Afib/ PE.  Pt diaphoretic and pale and arrived on NRB.  Pt received 500 NS enroute to ED and arrived with epi gtt infusing.

## 2015-06-08 NOTE — ED Notes (Signed)
Completed 1st unit of prbcs- O- Neg

## 2015-06-09 LAB — COMPREHENSIVE METABOLIC PANEL
ALK PHOS: 46 U/L (ref 38–126)
ALT: 9 U/L — ABNORMAL LOW (ref 17–63)
ANION GAP: 10 (ref 5–15)
AST: 8 U/L — ABNORMAL LOW (ref 15–41)
Albumin: 2.4 g/dL — ABNORMAL LOW (ref 3.5–5.0)
BUN: 94 mg/dL — AB (ref 6–20)
CHLORIDE: 119 mmol/L — AB (ref 101–111)
CO2: 14 mmol/L — ABNORMAL LOW (ref 22–32)
Calcium: 9 mg/dL (ref 8.9–10.3)
Creatinine, Ser: 10.61 mg/dL — ABNORMAL HIGH (ref 0.61–1.24)
GFR calc Af Amer: 5 mL/min — ABNORMAL LOW (ref >60)
GFR calc non Af Amer: 4 mL/min — ABNORMAL LOW (ref >60)
Glucose, Bld: 104 mg/dL — ABNORMAL HIGH (ref 65–99)
Potassium: 5.2 mmol/L — ABNORMAL HIGH (ref 3.5–5.1)
Sodium: 143 mmol/L (ref 135–145)
Total Bilirubin: 0.9 mg/dL (ref 0.3–1.2)
Total Protein: 5.1 g/dL — ABNORMAL LOW (ref 6.5–8.1)

## 2015-06-09 LAB — PREPARE RBC (CROSSMATCH)

## 2015-06-09 LAB — PREPARE FRESH FROZEN PLASMA
Unit division: 0
Unit division: 0

## 2015-06-09 LAB — CBC
HCT: 16.8 % — ABNORMAL LOW (ref 39.0–52.0)
Hemoglobin: 5.7 g/dL — CL (ref 13.0–17.0)
MCH: 30.2 pg (ref 26.0–34.0)
MCHC: 33.9 g/dL (ref 30.0–36.0)
MCV: 88.9 fL (ref 78.0–100.0)
Platelets: 131 10*3/uL — ABNORMAL LOW (ref 150–400)
RBC: 1.89 MIL/uL — ABNORMAL LOW (ref 4.22–5.81)
RDW: 16.4 % — ABNORMAL HIGH (ref 11.5–15.5)
WBC: 9.1 10*3/uL (ref 4.0–10.5)

## 2015-06-09 LAB — BLOOD PRODUCT ORDER (VERBAL) VERIFICATION

## 2015-06-09 LAB — PROTIME-INR
INR: 2.28 — AB (ref 0.00–1.49)
Prothrombin Time: 24.9 seconds — ABNORMAL HIGH (ref 11.6–15.2)

## 2015-06-09 MED ORDER — PIPERACILLIN-TAZOBACTAM IN DEX 2-0.25 GM/50ML IV SOLN
2.2500 g | Freq: Three times a day (TID) | INTRAVENOUS | Status: DC
  Administered 2015-06-09 – 2015-06-10 (×2): 2.25 g via INTRAVENOUS
  Filled 2015-06-09 (×4): qty 50

## 2015-06-09 MED ORDER — MORPHINE SULFATE 2 MG/ML IJ SOLN
1.0000 mg | INTRAMUSCULAR | Status: DC | PRN
  Administered 2015-06-09 – 2015-06-10 (×3): 2 mg via INTRAVENOUS
  Filled 2015-06-09 (×3): qty 1

## 2015-06-09 MED ORDER — FUROSEMIDE 10 MG/ML IJ SOLN
120.0000 mg | Freq: Two times a day (BID) | INTRAVENOUS | Status: DC
  Administered 2015-06-09 – 2015-06-10 (×3): 120 mg via INTRAVENOUS
  Filled 2015-06-09 (×4): qty 12

## 2015-06-09 MED ORDER — FUROSEMIDE 10 MG/ML IJ SOLN
60.0000 mg | Freq: Once | INTRAMUSCULAR | Status: AC
Start: 2015-06-09 — End: 2015-06-09
  Administered 2015-06-09: 60 mg via INTRAVENOUS
  Filled 2015-06-09: qty 6

## 2015-06-09 MED ORDER — SODIUM CHLORIDE 0.9 % IV SOLN
Freq: Once | INTRAVENOUS | Status: AC
  Administered 2015-06-09: 10:00:00 via INTRAVENOUS

## 2015-06-09 NOTE — Progress Notes (Signed)
Pt c/o of increased work of breathing with elevated respiratory rate and blood pressure. Ascultation of lung sounds revealed bilateral crackles in all lung fields. MD notified and new orders received. Will implement and continue to monitor.

## 2015-06-09 NOTE — Progress Notes (Signed)
ANTIBIOTIC CONSULT NOTE - INITIAL  Pharmacy Consult for Piperacillin/Tazobactam Indication: rule out sepsis  No Known Allergies  Patient Measurements: Height: 5\' 9"  (175.3 cm) Weight: 211 lb 10.3 oz (96 kg) IBW/kg (Calculated) : 70.7  Vital Signs: Temp: 99.2 F (37.3 C) (07/25 1909) Temp Source: Rectal (07/25 1909) BP: 133/80 mmHg (07/25 1800) Pulse Rate: 121 (07/25 1800) Intake/Output from previous day: 07/24 0701 - 07/25 0700 In: 5618 [I.V.:4400; Blood:609] Out: 600 [Urine:600] Intake/Output from this shift:    Labs:  Recent Labs  05/23/2015 0950 06/06/2015 1533 06/09/15 0427  WBC 13.4* 11.6* 9.1  HGB 7.0* 6.3* 5.7*  PLT 250 163 131*  CREATININE 10.63*  --  10.61*   Estimated Creatinine Clearance: 7.1 mL/min (by C-G formula based on Cr of 10.61).   Microbiology: Recent Results (from the past 720 hour(s))  MRSA PCR Screening     Status: None   Collection Time: 06/05/2015  2:27 PM  Result Value Ref Range Status   MRSA by PCR NEGATIVE NEGATIVE Final    Comment:        The GeneXpert MRSA Assay (FDA approved for NASAL specimens only), is one component of a comprehensive MRSA colonization surveillance program. It is not intended to diagnose MRSA infection nor to guide or monitor treatment for MRSA infections.     Medical History: Past Medical History  Diagnosis Date  . A-fib   . PE (pulmonary embolism)   . Chronic renal insufficiency, stage IV (severe)   . Bladder cancer   . Renal cyst, acquired   . Elevated PSA   . Deep venous thrombosis   . WPW (Wolff-Parkinson-White syndrome)   . Renal hematoma, left 2010  . BPH (benign prostatic hypertrophy) with urinary obstruction     Assessment: 74 y.o. male who presented with right-sided flank pain and abdominal swelling associated with near syncope. Patient currently with acute respiratory failure and possible sepsis due to T91.2, HR 120, RR 30s, and SBP 60s. No pertinent cultures pending at this time.    Goal of Therapy:  Eradication of infection  Plan:  - Initiate pip/tazo 2.25 g IV q8h - Patient does not want HD but will monitor HD plans during course of stay - Follow up culture results  Nicoletta Ba, PharmD, [place] Pharmacist [phone]

## 2015-06-09 NOTE — Progress Notes (Addendum)
Patient ID: [name], male   DOB: December 20, 1940, 74 y.o.   MRN: [id number]  [facility] PROGRESS NOTE  [name] HWK:[id number] DOB: September 27, 1941 DOA: 05/18/2015 PCP: PROVIDER NOT IN SYSTEM   Brief narrative:    74 year old male with A-fib and PE (pulmonary embolism), CKD stage 4-5, presented to [facility] ED with main concern of right sided flank pain and abd swelling several days in duration associated with near syncope.   In the ED CT scan of the abdomen and pelvis showed large hematoma involving the right kidney with extension into the perinephric space, retroperitoneum and the peritoneal space. Urology and nephrology teams consulted and [facility] asked to admit for further evaluation.   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 Ruptured kidney cysts - no urological intervention needed for now - appreciate urologist following  Acute respiratory failure - secondary to volume overload - continue lasix, again pt declines HD Acute thrombocytopenia  - in the setting of acute illness - CBC in AM Severe sepsis  - pt met criteria for sepsis with T 91.2, HR up to 120, RR in 30's and SBP in 60's, ABC 13 K, elevated lactic acid  - source is not clear and could be related to acute bleeding, hematoma, ruptured hemorrhagic cysts - no signs of PNA on CXR, WBC is already trending down - ABX have not been started in admission - will start empiric Zosyn for now, avoid vancomycin due to renal failure - urine culture and UA requested, repeat lactic acid in  AM Hyperkalemia - in the setting of bleeding and CKD - supportive care  Metabolic acidosis  - secondary to principal problem - per nephrology team, added bicarb A-fib on AC - IN 2.28 this AM - continue to hold [facility] Obesity  - Body mass index is 31.24 kg/(m^2).  DVT prophylaxis - scd  Code Status: DNR Family Communication:  plan of care discussed with the patient Disposition Plan: not ready for d/c  IV access:  Peripheral IV  Procedures and diagnostic studies:    Ct Abdomen Pelvis Wo Contrast 05/26/2015  Large hematoma involving the right kidney with extension into the perinephric space, the retroperitoneum, and the peritoneal space as there is moderate hemoperitoneum. 2. Innumerable cysts involving both kidneys, many of which are hemorrhagic. A ruptured hemorrhagic cyst from the right kidney may be the source of the hemorrhage. There is no other identifiable source for the hemorrhage. 3. Stable marked prostate gland enlargement dating back to 2011. 4. Very small right pleural effusion. 5. Right femoral central venous catheter tip in the right external iliac vein.  Dg Chest Portable 1 View 06/03/2015  No active disease.    Medical Consultants:  Nephrology Urology PCT   Other Consultants:  None   IAnti-Infectives:   None  Faye Ramsay, MD  [facility] Pager [phone]  If 7PM-7AM, please contact night-coverage [email or web address] Password [facility] 06/09/2015, 6:48 PM   LOS: 1 day   HPI/Subjective: No events overnight. Reports dyspnea   Objective: Filed Vitals:  06/09/15 1001 06/09/15 1054 06/09/15 1100 06/09/15 1500  BP: 107/65  120/71   Pulse: 95 96    Temp: 98.6 F (37 C) 97.9 F (36.6 C) 97.9 F (36.6 C) 99 F (37.2 C)  TempSrc: Oral Oral Oral Rectal  Resp: 27 34    Height:      Weight:      SpO2: 100% 100%      Intake/Output Summary (Last 24 hours) at 06/09/15 1848 Last data filed at 06/09/15 1300  Gross per 24 hour  Intake    335 ml  Output    600 ml  Net    -265 ml    Exam:   General:  Pt is alert, tired and frail, NAD   Cardiovascular: Regular  Rhythm, tachycardic, no rubs, no gallops  Respiratory: Tachypnea with crackles at bases and rhonchi   Abdomen: Soft, tender in epigastric area, distended, bowel sounds present, no guarding  Extremities: pulses DP and PT palpable bilaterally   Data Reviewed: Basic Metabolic Panel:  Recent Labs Lab 05/18/2015 0950 06/09/15 0427  NA 142 143  K 5.2* 5.2*  CL 117* 119*  CO2 12* 14*  GLUCOSE 180* 104*  BUN 98* 94*  CREATININE 10.63* 10.61*  CALCIUM 9.3 9.0   Liver Function Tests:  Recent Labs Lab 05/30/2015 0950 06/09/15 0427  AST 12* 8*  ALT 9* 9*  ALKPHOS 50 46  BILITOT 0.6 0.9  PROT 5.7* 5.1*  ALBUMIN 2.4* 2.4*   CBC:  Recent Labs Lab 05/16/2015 0950 05/16/2015 1533 06/09/15 0427  WBC 13.4* 11.6* 9.1  NEUTROABS  --  10.1*  --   HGB 7.0* 6.3* 5.7*  HCT 21.6* 19.1* 16.8*  MCV 94.7 90.1 88.9  PLT 250 163 131*   Cardiac Enzymes:  Recent Labs Lab 05/22/2015 0952  TROPONINI <0.03    Recent Results (from the past 240 hour(s))  MRSA PCR Screening     Status: None   Collection Time: 06/07/2015  2:27 PM  Result Value Ref Range Status   MRSA by PCR NEGATIVE NEGATIVE Final    Comment:        The GeneXpert MRSA Assay (FDA approved for NASAL specimens only), is one component of a comprehensive MRSA colonization surveillance program. It is not intended to diagnose MRSA infection nor to guide or monitor treatment for MRSA infections.      Scheduled Meds: . darbepoetin (ARANESP) injection - NON-DIALYSIS  150 mcg Subcutaneous Q Mon-1800  . furosemide  120 mg Intravenous Q12H  . patiromer  8.4 g Oral Daily  . sodium bicarbonate  650 mg Oral TID   Continuous Infusions:

## 2015-06-09 NOTE — Progress Notes (Signed)
Patient ID: [name], male   DOB: November 08, 1941, 74 y.o.   MRN: [id number]    Subjective: [name] is feeling better today with reduced pain.  He is now normothermic and normotensive.  His Hgb drifted down to 5.7 from 6.3 despite 2u PRBC's.  This is probably a combination of dilution and further bleeding.  He has been able to void about 646ml and feels he is emptying.  His Cr is stable and his INR has fallen some.  ROS:  Review of Systems  Constitutional: Negative for fever.  Respiratory: Negative for shortness of breath.   Cardiovascular: Negative for chest pain.  Gastrointestinal: Positive for abdominal pain (but improved).  Neurological:       He reports a burning sensation that alternates between the right and left heels.     Anti-infectives: Anti-infectives    None      Current Facility-Administered Medications  Medication Dose Route Frequency Provider Last Rate Last Dose  . acetaminophen (TYLENOL) tablet 650 mg  650 mg Oral Q6H PRN Oswald Hillock, MD       Or  . acetaminophen (TYLENOL) suppository 650 mg  650 mg Rectal Q6H PRN Oswald Hillock, MD      . Darbepoetin Alfa (ARANESP) injection 150 mcg  150 mcg Subcutaneous Q Mon-1800 Corliss Parish, MD      . furosemide (LASIX) injection 40 mg  40 mg Intravenous Q12H Corliss Parish, MD   40 mg at 06/07/2015 1806  . HYDROcodone-acetaminophen (NORCO/VICODIN) 5-325 MG per tablet 1 tablet  1 tablet Oral Q6H PRN Oswald Hillock, MD   1 tablet at 06/02/2015 1807  . ondansetron (ZOFRAN) tablet 4 mg  4 mg Oral Q6H PRN Oswald Hillock, MD       Or  . ondansetron (ZOFRAN) injection 4 mg  4 mg Intravenous Q6H PRN Oswald Hillock, MD      . patiromer Daryll Drown) packet 8.4 g  8.4 g Oral Daily Oswald Hillock, MD   8.4 g at 05/29/2015 2130  . sodium bicarbonate tablet 650 mg  650 mg Oral TID Corliss Parish, MD   650 mg at 06/07/2015 2130     Objective: Vital signs in last 24 hours: Temp:  [91.2 F (32.9 C)-98.8 F (37.1 C)] 98.5 F (36.9 C)  (07/25 0450) Pulse Rate:  [67-99] 74 (07/25 0500) Resp:  [18-30] 29 (07/25 0500) BP: (60-142)/(38-84) 103/54 mmHg (07/25 0500) SpO2:  [99 %-100 %] 100 % (07/25 0500) Weight:  [96 kg (211 lb 10.3 oz)] 96 kg (211 lb 10.3 oz) (07/24 1454)  Intake/Output from previous day: 07/24 0701 - 07/25 0700 In: 5618 [I.V.:4400; Blood:609] Out: 600 [Urine:600] Intake/Output this shift: Total I/O In: -  Out: 300 [Urine:300]   Physical Exam  Constitutional: He is oriented to person, place, and time and well-developed, well-nourished, and in no distress.  Cardiovascular: Normal rate and regular rhythm.   With some ectopy  Pulmonary/Chest: Effort normal. No respiratory distress.  Abdominal: Soft. He exhibits mass (in the RUQ below the lateral ribs consistent with his hematoma). There is tenderness (in the RUQ but it is reduced).  Neurological: He is alert and oriented to person, place, and time.    Lab Results:   Recent Labs  06/15/2015 1533 06/09/15 0427  WBC 11.6* 9.1  HGB 6.3* 5.7*  HCT 19.1* 16.8*  PLT 163 131*   BMET  Recent Labs  05/24/2015 0950 06/09/15 0427  NA 142 143  K 5.2* 5.2*  CL  117* 119*  CO2 12* 14*  GLUCOSE 180* 104*  BUN 98* 94*  CREATININE 10.63* 10.61*  CALCIUM 9.3 9.0   PT/INR  Recent Labs  05/17/2015 0950 06/09/15 0427  LABPROT 28.9* 24.9*  INR 2.78* 2.28*   ABG No results for input(s): PHART, HCO3 in the last 72 hours.  Invalid input(s): PCO2, PO2  Studies/Results: Ct Abdomen Pelvis Wo Contrast  05/29/2015   CLINICAL DATA:  74 year old on long-term anticoagulation for atrial fibrillation and prior pulmonary emboli, presenting to the emergency department with abdominal distention and hypotension. Evaluate for intra-abdominal bleeding. Current history of stage 4 chronic kidney disease. Remote personal history of bladder cancer. Personal history of left perinephric hematoma in 2010.  EXAM: CT ABDOMEN AND PELVIS WITHOUT CONTRAST  TECHNIQUE: Multidetector  CT imaging of the abdomen and pelvis was performed following the standard protocol without IV contrast.  COMPARISON:  08/11/2010 dating back to 11/30/2008.  FINDINGS: Hepatobiliary: Liver normal in size and appearance for the unenhanced technique. Normal-appearing gallbladder without calcified gallstones. No biliary ductal dilation.  Spleen:  Normal in size and appearance.  Pancreas:  Normal in appearance.  No pancreatic ductal dilation.  Adrenal glands:  Normal in appearance.  Genitourinary: Large hematoma involving the right kidney extending into the perinephric space, the retroperitoneum, the root of the mesentery, into the peritoneal cavity with moderate hemoperitoneum and in the right upper quadrant below the liver surrounding the gallbladder and in the paracolic gutters bilaterally.  Numerous cysts involving both kidneys, many of which appear hemorrhagic, significantly increased in number since the prior CTs. Hemorrhagic cysts arising from the anterior and posterior portion of the mid left kidney measure approximately 5 cm and 7 cm respectively. A solid renal mass is difficult to exclude on the unenhanced study, but no discrete solid mass is identified in either kidney. No evidence of urinary tract calculi on either side. Extensive arterial calcification involving the polar branches of both kidneys. Urinary bladder unremarkable.  Marked prostate gland enlargement, maximum measurements approximating 4.5 x 5.6 x 4.8 cm. Normal seminal vesicles.  Gastrointestinal: Stomach normal in appearance for degree of distention. Normal-appearing small bowel. Scattered sigmoid colon diverticula without evidence of acute diverticulitis. Remainder the colon decompressed and unremarkable. Normal appendix in the right upper pelvis, identified medial to the right perinephric hematoma.  Ascites:  Moderate hemoperitoneum.  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 Visceral artery atherosclerosis. IVC  filter in the infrarenal IVC. IVC decompressed consistent with hypotension. Right femoral central venous catheter tip in the right external iliac vein.  Lymphatic:  No pathologic lymphadenopathy in the abdomen or pelvis.  Other findings: None.  Musculoskeletal: Spondylosis involving the lower thoracic and upper lumbar spine. Numerous Schmorl's nodes. Facet degenerative changes involving the lower lumbar spine.  Visualized lower thorax: Heart enlarged likely with left ventricular hypertrophy. Severe aortic valvular calcification. Right coronary artery atherosclerosis. No pericardial effusion. Visualized lung bases clear. Very small right pleural effusion.  IMPRESSION: 1. Large hematoma involving the right kidney with extension into the perinephric space, the retroperitoneum, and the peritoneal space as there is moderate hemoperitoneum. 2. Innumerable cysts involving both kidneys, many of which are hemorrhagic. A ruptured hemorrhagic cyst from the right kidney may be the source of the hemorrhage. There is no other identifiable source for the hemorrhage. 3. Stable marked prostate gland enlargement dating back to 2011. 4. Very small right pleural effusion. 5. Right femoral central venous catheter tip in the right external iliac vein. I telephoned this critical value/emergency results at the time  of interpretation on 05/21/2015 at 11:05 am to Dr. Ezequiel Essex, who verbally acknowledged these results.   Electronically Signed   By: Evangeline Dakin M.D.   On: 05/21/2015 11:09   Dg Chest Portable 1 View  05/20/2015   CLINICAL DATA:  Weakness. History of atrial fibrillation and pulmonary embolus.  EXAM: PORTABLE CHEST - 1 VIEW  COMPARISON:  11/15/2011  FINDINGS: The cardiac silhouette is upper limits of normal in size. The patient has taken a shallower inspiration than on the prior study with mild crowding of the bronchovascular markings centrally. No confluent airspace opacity, overt pulmonary edema, pleural effusion,  or pneumothorax is identified, although the far lateral aspect of the right lung base was incompletely imaged. Numerous monitoring leads overlie the chest. Thoracolumbar spondylosis is noted.  IMPRESSION: No active disease.   Electronically Signed   By: Logan Bores   On: 06/10/2015 11:02   Labs, orders and Hospital notes reviewed.   Assessment and Plan: Renal hemorrhage, right He has an acute right renal hemorrhage possibly from one of the multiple cysts aggravated by anticoagulation.  He is feeling better today with reduced pain and his BP has improved with hydration and 2 unit transfusion but his Hgb has drifted down to 5.7 from 6.3.   His Cr is stable.  He is voiding comfortably.  INR down to 2.28.  Continue current care.  No need for IR or surgical intervention at this time.    I will continue to follow.           LOS: 1 day    Zyanna Leisinger J 06/09/2015

## 2015-06-09 NOTE — Progress Notes (Signed)
[facility] ROUNDING NOTE   Subjective:   Interval History: patient is receiving blood transfusion but comfortable  Objective:  Vital signs in last 24 hours:  Temp:  [91.2 F (32.9 C)-98.8 F (37.1 C)] 98.5 F (36.9 C) (07/25 0830) Pulse Rate:  [67-99] 91 (07/25 0830) Resp:  [18-30] 26 (07/25 0830) BP: (98-142)/(54-78) 109/70 mmHg (07/25 0830) SpO2:  [99 %-100 %] 100 % (07/25 0830) Weight:  [96 kg (211 lb 10.3 oz)] 96 kg (211 lb 10.3 oz) (07/24 1454)  Weight change:  Filed Weights   05/27/2015 1454  Weight: 96 kg (211 lb 10.3 oz)    Intake/Output: I/O last 3 completed shifts: In: 5618 [I.V.:4400; Blood:609; Other:609] Out: 600 [Urine:600]   Intake/Output this shift:     General: Alert. Complaining of pain on his side otherwise appears comfortable  HEENT: Pupils are equal round reactive to light, extra ocular motions are intact, mucous membranes are moist  Neck: Mild JVD  Heart: Regular rate and rhythm without murmur, gallop, or rub  Lungs: Decreased breath sounds at the bases  Abdomen: Distended, diffusely tender  Extremities: Trace to 1+ edema bilaterally  Skin: Pale and dry  Neuro: Alert and oriented. The remainder of the neurologic exam is nonfocal    Basic Metabolic Panel:  Recent Labs Lab 06/07/2015 0950 06/09/15 0427  NA 142 143  K 5.2* 5.2*  CL 117* 119*  CO2 12* 14*  GLUCOSE 180* 104*  BUN 98* 94*  CREATININE 10.63* 10.61*  CALCIUM 9.3 9.0    Liver Function Tests:  Recent Labs Lab 06/04/2015 0950 06/09/15 0427  AST 12* 8*  ALT 9* 9*  ALKPHOS 50 46  BILITOT 0.6 0.9  PROT 5.7* 5.1*  ALBUMIN 2.4* 2.4*   No results for input(s): LIPASE, AMYLASE in the last 168 hours. No results for input(s): AMMONIA in the last 168 hours.  CBC:  Recent Labs Lab 05/24/2015 0950 05/28/2015 1533 06/09/15 0427  WBC 13.4* 11.6* 9.1  NEUTROABS  --  10.1*  --   HGB 7.0* 6.3* 5.7*  HCT 21.6* 19.1* 16.8*  MCV 94.7 90.1 88.9  PLT 250 163  131*    Cardiac Enzymes:  Recent Labs Lab 05/22/2015 0952  TROPONINI <0.03    BNP: Invalid input(s): POCBNP  CBG: No results for input(s): GLUCAP in the last 168 hours.  Microbiology: Results for orders placed or performed during the hospital encounter of 06/03/2015  MRSA PCR Screening     Status: None   Collection Time: 05/31/2015  2:27 PM  Result Value Ref Range Status   MRSA by PCR NEGATIVE NEGATIVE Final    Comment:        The GeneXpert MRSA Assay (FDA approved for NASAL specimens only), is one component of a comprehensive MRSA colonization surveillance program. It is not intended to diagnose MRSA infection nor to guide or monitor treatment for MRSA infections.     Coagulation Studies:  Recent Labs  06/06/15 1221 06/05/2015 0950 06/09/15 0427  LABPROT  --  28.9* 24.9*  INR 3.2 2.78* 2.28*    Urinalysis: No results for input(s): COLORURINE, LABSPEC, PHURINE, GLUCOSEU, HGBUR, BILIRUBINUR, KETONESUR, PROTEINUR, UROBILINOGEN, NITRITE, LEUKOCYTESUR in the last 72 hours.  Invalid input(s): APPERANCEUR    Imaging: Ct Abdomen Pelvis Wo Contrast  05/16/2015   CLINICAL DATA:  74 year old on long-term anticoagulation for atrial fibrillation and prior pulmonary emboli, presenting to the emergency department with abdominal distention and hypotension. Evaluate for intra-abdominal bleeding. Current history of stage 4 chronic kidney disease.  Remote personal history of bladder cancer. Personal history of left perinephric hematoma in 2010.  EXAM: CT ABDOMEN AND PELVIS WITHOUT CONTRAST  TECHNIQUE: Multidetector CT imaging of the abdomen and pelvis was performed following the standard protocol without IV contrast.  COMPARISON:  08/11/2010 dating back to 11/30/2008.  FINDINGS: Hepatobiliary: Liver normal in size and appearance for the unenhanced technique. Normal-appearing gallbladder without calcified gallstones. No biliary ductal dilation.  Spleen:  Normal in size and appearance.   Pancreas:  Normal in appearance.  No pancreatic ductal dilation.  Adrenal glands:  Normal in appearance.  Genitourinary: Large hematoma involving the right kidney extending into the perinephric space, the retroperitoneum, the root of the mesentery, into the peritoneal cavity with moderate hemoperitoneum and in the right upper quadrant below the liver surrounding the gallbladder and in the paracolic gutters bilaterally.  Numerous cysts involving both kidneys, many of which appear hemorrhagic, significantly increased in number since the prior CTs. Hemorrhagic cysts arising from the anterior and posterior portion of the mid left kidney measure approximately 5 cm and 7 cm respectively. A solid renal mass is difficult to exclude on the unenhanced study, but no discrete solid mass is identified in either kidney. No evidence of urinary tract calculi on either side. Extensive arterial calcification involving the polar branches of both kidneys. Urinary bladder unremarkable.  Marked prostate gland enlargement, maximum measurements approximating 4.5 x 5.6 x 4.8 cm. Normal seminal vesicles.  Gastrointestinal: Stomach normal in appearance for degree of distention. Normal-appearing small bowel. Scattered sigmoid colon diverticula without evidence of acute diverticulitis. Remainder the colon decompressed and unremarkable. Normal appendix in the right upper pelvis, identified medial to the right perinephric hematoma.  Ascites:  Moderate hemoperitoneum.  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 Visceral artery atherosclerosis. IVC filter in the infrarenal IVC. IVC decompressed consistent with hypotension. Right femoral central venous catheter tip in the right external iliac vein.  Lymphatic:  No pathologic lymphadenopathy in the abdomen or pelvis.  Other findings: None.  Musculoskeletal: Spondylosis involving the lower thoracic and upper lumbar spine. Numerous Schmorl's nodes. Facet degenerative  changes involving the lower lumbar spine.  Visualized lower thorax: Heart enlarged likely with left ventricular hypertrophy. Severe aortic valvular calcification. Right coronary artery atherosclerosis. No pericardial effusion. Visualized lung bases clear. Very small right pleural effusion.  IMPRESSION: 1. Large hematoma involving the right kidney with extension into the perinephric space, the retroperitoneum, and the peritoneal space as there is moderate hemoperitoneum. 2. Innumerable cysts involving both kidneys, many of which are hemorrhagic. A ruptured hemorrhagic cyst from the right kidney may be the source of the hemorrhage. There is no other identifiable source for the hemorrhage. 3. Stable marked prostate gland enlargement dating back to 2011. 4. Very small right pleural effusion. 5. Right femoral central venous catheter tip in the right external iliac vein. I telephoned this critical value/emergency results at the time of interpretation on 06/15/2015 at 11:05 am to Dr. Ezequiel Essex, who verbally acknowledged these results.   Electronically Signed   By: Evangeline Dakin M.D.   On: 05/31/2015 11:09   Dg Chest Portable 1 View  05/18/2015   CLINICAL DATA:  Weakness. History of atrial fibrillation and pulmonary embolus.  EXAM: PORTABLE CHEST - 1 VIEW  COMPARISON:  11/15/2011  FINDINGS: The cardiac silhouette is upper limits of normal in size. The patient has taken a shallower inspiration than on the prior study with mild crowding of the bronchovascular markings centrally. No confluent airspace opacity, overt pulmonary edema, pleural effusion, or  pneumothorax is identified, although the far lateral aspect of the right lung base was incompletely imaged. Numerous monitoring leads overlie the chest. Thoracolumbar spondylosis is noted.  IMPRESSION: No active disease.   Electronically Signed   By: Logan Bores   On: 05/21/2015 11:02     Medications:     . darbepoetin (ARANESP) injection - NON-DIALYSIS  150  mcg Subcutaneous Q Mon-1800  . furosemide  40 mg Intravenous Q12H  . patiromer  8.4 g Oral Daily  . sodium bicarbonate  650 mg Oral TID   acetaminophen **OR** acetaminophen, HYDROcodone-acetaminophen, ondansetron **OR** ondansetron (ZOFRAN) IV  Assessment/ Plan:  Assessment/Plan: 74 year old white male with history of advanced CK D deciding not to pursue dialysis who now presents with a large right-sided hematoma with a hemoglobin of 6.3 and hemodynamic instability  1.Renal- advanced CKD which is not new. Creatinine was 9.5 a month ago. Patient reaffirms his decision to not pursue dialysis under any circumstances. This decision will be honored.2.Hematoma - causing anemia and hypotension. Patient is receiving blood. Surgery has been consult and and opts for conservative management right now. Hopefully bleeding will stop  3. Hyperkalemia - is not too significant at this time. He will continue his home dose of the veltassa daily - also lasix and sodium bicarb may help 4. Anemia - due to acute blood loss. Aranesp and blood  5. Metabolic acidosis- will add sodium bicarbonate bicarbonate slightly improved 6. Volume- High risk of volume overload would actually increase lasix dose    LOS: 1 Jaylissa Felty W @TODAY @10 :15 AM

## 2015-06-09 NOTE — Progress Notes (Signed)
UR COMPLETED  

## 2015-06-10 ENCOUNTER — Encounter (HOSPITAL_COMMUNITY): Payer: Self-pay | Admitting: *Deleted

## 2015-06-10 DIAGNOSIS — N281 Cyst of kidney, acquired: Secondary | ICD-10-CM

## 2015-06-10 DIAGNOSIS — Z789 Other specified health status: Secondary | ICD-10-CM

## 2015-06-10 LAB — TYPE AND SCREEN
ABO/RH(D): A POS
Antibody Screen: NEGATIVE
UNIT DIVISION: 0
UNIT DIVISION: 0
Unit division: 0
Unit division: 0
Unit division: 0

## 2015-06-10 LAB — BASIC METABOLIC PANEL
Anion gap: 14 (ref 5–15)
BUN: 105 mg/dL — AB (ref 6–20)
CO2: 13 mmol/L — ABNORMAL LOW (ref 22–32)
Calcium: 9.1 mg/dL (ref 8.9–10.3)
Chloride: 115 mmol/L — ABNORMAL HIGH (ref 101–111)
Creatinine, Ser: 11.78 mg/dL — ABNORMAL HIGH (ref 0.61–1.24)
GFR calc Af Amer: 4 mL/min — ABNORMAL LOW (ref >60)
GFR calc non Af Amer: 4 mL/min — ABNORMAL LOW (ref >60)
Glucose, Bld: 95 mg/dL (ref 65–99)
POTASSIUM: 5.7 mmol/L — AB (ref 3.5–5.1)
Sodium: 142 mmol/L (ref 135–145)

## 2015-06-10 LAB — URINALYSIS, ROUTINE W REFLEX MICROSCOPIC
Bilirubin Urine: NEGATIVE
GLUCOSE, UA: 100 mg/dL — AB
KETONES UR: 15 mg/dL — AB
NITRITE: NEGATIVE
PH: 6 (ref 5.0–8.0)
Protein, ur: 100 mg/dL — AB
SPECIFIC GRAVITY, URINE: 1.009 (ref 1.005–1.030)
Urobilinogen, UA: 0.2 mg/dL (ref 0.0–1.0)

## 2015-06-10 LAB — CBC
HEMATOCRIT: 18.4 % — AB (ref 39.0–52.0)
Hemoglobin: 6.2 g/dL — CL (ref 13.0–17.0)
MCH: 30.4 pg (ref 26.0–34.0)
MCHC: 33.7 g/dL (ref 30.0–36.0)
MCV: 90.2 fL (ref 78.0–100.0)
PLATELETS: 109 10*3/uL — AB (ref 150–400)
RBC: 2.04 MIL/uL — ABNORMAL LOW (ref 4.22–5.81)
RDW: 16.7 % — ABNORMAL HIGH (ref 11.5–15.5)
WBC: 11.5 10*3/uL — ABNORMAL HIGH (ref 4.0–10.5)

## 2015-06-10 LAB — URINE MICROSCOPIC-ADD ON

## 2015-06-10 LAB — LACTIC ACID, PLASMA: Lactic Acid, Venous: 0.7 mmol/L (ref 0.5–2.0)

## 2015-06-10 MED ORDER — SODIUM CHLORIDE 0.9 % IV SOLN
0.1000 mg/h | INTRAVENOUS | Status: DC
  Administered 2015-06-10: 0.6 mg/h via INTRAVENOUS
  Filled 2015-06-10: qty 2.5

## 2015-06-10 MED ORDER — METOPROLOL TARTRATE 1 MG/ML IV SOLN
5.0000 mg | INTRAVENOUS | Status: DC | PRN
  Filled 2015-06-10: qty 5

## 2015-06-10 MED ORDER — HYDROMORPHONE BOLUS VIA INFUSION
0.5000 mg | INTRAVENOUS | Status: DC | PRN
  Filled 2015-06-10: qty 1

## 2015-06-10 MED ORDER — FUROSEMIDE 10 MG/ML IJ SOLN
80.0000 mg | Freq: Once | INTRAMUSCULAR | Status: DC
  Filled 2015-06-10: qty 8

## 2015-06-10 MED ORDER — GLYCOPYRROLATE 0.2 MG/ML IJ SOLN: 0.1000 mg | INTRAMUSCULAR | Status: DC | PRN

## 2015-06-10 MED ORDER — SODIUM CHLORIDE 0.9 % IV SOLN
1.0000 mg/h | INTRAVENOUS | Status: DC
  Administered 2015-06-10: 2 mg/h via INTRAVENOUS
  Filled 2015-06-10: qty 10

## 2015-06-10 MED ORDER — DIAZEPAM 5 MG/ML IJ SOLN: 5.0000 mg | INTRAMUSCULAR | Status: DC | PRN

## 2015-06-10 NOTE — Progress Notes (Addendum)
Patient ID: [name], male   DOB: 1941-03-29, 74 y.o.   MRN: [id number]  [facility] PROGRESS NOTE  [name] YPP:[id number] DOB: 1941-03-11 DOA: 05/24/2015 PCP: PROVIDER NOT IN SYSTEM   Brief narrative:    74 year old male with A-fib and PE (pulmonary embolism), CKD stage 4-5, presented to [facility] ED with main concern of right sided flank pain and abd swelling several days in duration associated with near syncope.   In the ED CT scan of the abdomen and pelvis showed large hematoma involving the right kidney with extension into the perinephric space, retroperitoneum and the peritoneal space. Urology and nephrology teams consulted and [facility] asked to admit for further evaluation.   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 - supportive care and focus on comfort per pt request  - spoke with wife who is agreeable to full comfort as she knows this is what pt wants  Right kidney hematoma, ? Ruptured kidney cysts - no urological intervention needed for now - appreciate urologist following  Acute thrombocytopenia  - in the setting of acute illness Severe sepsis present on admission  - pt met criteria for sepsis with T 91.2, HR up to 120, RR in 30's and SBP in 60's, ABC 13 K, elevated lactic acid  - source is not clear and could be related to acute bleeding, hematoma, ruptured hemorrhagic cysts - no signs of PNA on CXR, WBC is already  trending down - started empiric Zosyn but wife and pt very clear that comfort is the goal and will respect the wishes  Hyperkalemia - in the setting of bleeding and CKD - supportive care  Metabolic acidosis  - secondary to principal problem A-fib on AC - IN 2.28 this AM - continue to hold [facility] Obesity  - Body mass index is 31.24 kg/(m^2).  DVT prophylaxis - scd  Code Status: DNR Family Communication:  plan of care discussed with the patient Disposition Plan: not ready for d/c  IV access:  Peripheral IV  Procedures and diagnostic studies:    Ct Abdomen Pelvis Wo Contrast 06/05/2015  Large hematoma involving the right kidney with extension into the perinephric space, the retroperitoneum, and the peritoneal space as there is moderate hemoperitoneum. 2. Innumerable cysts involving both kidneys, many of which are hemorrhagic. A ruptured hemorrhagic cyst from the right kidney may be the source of the hemorrhage. There is no other identifiable source for the hemorrhage. 3. Stable marked prostate gland enlargement dating back to 2011. 4. Very small right pleural effusion. 5. Right femoral central venous catheter tip in the right external iliac vein.  Dg Chest Portable 1 View 06/09/2015  No active disease.    Medical Consultants:  Nephrology Urology PCT   Other Consultants:  None   IAnti-Infectives:   None  Faye Ramsay, MD  [employer] Pager [phone]  If 7PM-7AM, please contact night-coverage [email or web address] Password [facility] 06/10/2015, 8:13  AM   LOS: 2 days   HPI/Subjective: No events overnight. Reports dyspnea   Objective: Filed Vitals:   06/09/15 2230 06/10/15 0505 06/10/15 0625 06/10/15 0640  BP: 123/86 106/64 119/78 114/74  Pulse: 116 115 67 89  Temp:  98.1 F (36.7 C)    TempSrc:  Rectal    Resp: _0 Height:      Weight:      SpO2: 100% 100% 100% 100%    Intake/Output Summary (Last 24 hours) at 06/10/15 0813 Last data filed at 06/10/15 0700  Gross per  24 hour  Intake    675 ml  Output   1000 ml  Net   -325 ml    Exam:   General:  Pt is alert, tired and frail, in mild distress due to dyspnea   Cardiovascular: Regular  Rhythm, tachycardic, no rubs, no gallops  Respiratory: Tachypnea with crackles at bases and rhonchi   Abdomen: Soft, tender in epigastric area, distended, bowel sounds present, no guarding   Data Reviewed: Basic Metabolic Panel:  Recent Labs Lab 06/14/2015 0950 06/09/15 0427 06/10/15 0515  NA 142 143 142  K 5.2* 5.2* 5.7*  CL 117* 119* 115*  CO2 12* 14* 13*  GLUCOSE 180* 104* 95  BUN 98* 94* 105*  CREATININE 10.63* 10.61* 11.78*  CALCIUM 9.3 9.0 9.1   Liver Function Tests:  Recent Labs Lab 05/25/2015 0950 06/09/15 0427  AST 12* 8*  ALT 9* 9*  ALKPHOS 50 46  BILITOT 0.6 0.9  PROT 5.7* 5.1*  ALBUMIN 2.4* 2.4*   CBC:  Recent Labs Lab 05/27/2015 0950 06/09/2015 1533 06/09/15 0427 06/10/15 0515  WBC 13.4* 11.6* 9.1 11.5*  NEUTROABS  --  10.1*  --   --   HGB 7.0* 6.3* 5.7* 6.2*  HCT 21.6* 19.1* 16.8* 18.4*  MCV 94.7 90.1 88.9 90.2  PLT 250 163 131* 109*   Cardiac Enzymes:  Recent Labs Lab 06/13/2015 0952  TROPONINI <0.03    Recent Results (from the past 240 hour(s))  MRSA PCR Screening     Status: None   Collection Time: 05/28/2015  2:27 PM  Result Value Ref Range Status   MRSA by PCR NEGATIVE NEGATIVE Final    Comment:        The GeneXpert MRSA Assay (FDA approved for NASAL specimens only), is one component of a comprehensive MRSA colonization surveillance program. It is not intended to diagnose MRSA infection nor to guide or monitor treatment for MRSA infections.      Scheduled Meds: . darbepoetin (ARANESP) injection - NON-DIALYSIS  150 mcg Subcutaneous Q Mon-1800  . furosemide  120 mg Intravenous Q12H  . patiromer  8.4 g Oral Daily  . piperacillin-tazobactam (ZOSYN)  IV  2.25 g Intravenous 3 times per day  . sodium bicarbonate  650 mg Oral TID   Continuous Infusions: .  morphine

## 2015-06-10 NOTE — Progress Notes (Signed)
Pt transferred to [id number] per MD order. All questions answered and family aware of transfer.

## 2015-06-10 NOTE — Progress Notes (Signed)
Pt HR now sustaining 140s-170s ST BP 90s/60s. Attending K. Schorr paged. Wife notified of situation and that these vital trends could indicate approaching death. Connection lost after this statement. RN recalled and no response. Pt endorses left should ache.  @0646  pt wife called back endorsing she was in route to hospital.

## 2015-06-10 NOTE — Consult Note (Signed)
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 Now on comfort measures. He is remarkably alert. Family at bedside. He has been placed on a morphine infusion. He is having signs of myoclonus and twitching in his left arm- I do not recommend using morphine in ESRD patients due to symptoms of myoclonus and neurotoxicity- this is a contraindication and he may need opiate infusion for a few days-prognostic uncertainty but he is probably looking at hours- days. I recommend using hydromorphone that does not have toxic metabolites in ESRD-I mande this change. Additionally, I ordered standard Palliative prophylaxis.Will follow.  Time: 35 minutes 12:30-1:05PM  Lane Hacker, [place]

## 2015-06-10 NOTE — Progress Notes (Signed)
Dr. Doyle Askew called pt is more uncomfortable and in more pain, has more labored breathing as well. New orders received, will implement and continue to monitor.

## 2015-06-10 NOTE — Care Management Important Message (Signed)
Important Message  Patient Details  Name: REUEL LAMADRID MRN: [id number] Date of Birth: [birth year]   Medicare Important Message Given:  Yes-second notification given    Nathen May 06/10/2015, 2:03 [place] Message  Patient Details  Name: ZADYN YARDLEY MRN: [id number] Date of Birth: 1940-11-20   Medicare Important Message Given:  Yes-second notification given    Nathen May 06/10/2015, 2:03 PM

## 2015-06-10 NOTE — Clinical Documentation Improvement (Signed)
Please specify diagnosis related to below supporting information, if appropriate.  Possible Clinical Conditions? Severe Sepsis secondary to R renal hematoma / ? ruptured renal cyst, present on admission Other Condition  Cannot clinically Determine   Supporting Information: Per 06/10/15 MD progress note = Severe sepsis  - pt met criteria for sepsis with T 91.2, HR up to 120, RR in 30's and SBP in 60's, ABC 13 K, elevated lactic acid  - source is not clear and could be related to acute bleeding, hematoma, ruptured hemorrhagic cysts - no signs of PNA on CXR, WBC is already trending down - started empiric Zosyn but wife and pt very clear that comfort is the goal and will respect the wishes   Per 06/09/15 MD progress note = Severe sepsis  - pt met criteria for sepsis with T 91.2, HR up to 120, RR in 30's and SBP in 60's, ABC 13 K, elevated lactic acid  - source is not clear and could be related to acute bleeding, hematoma, ruptured hemorrhagic cysts - no signs of PNA on CXR, WBC is already trending down - ABX have been held as no clear infectious source identified - will start empiric Zosyn for now, avoid vancomycin due to renal failure - urine culture and UA requested, repeat lactic acid in AM  Patient's labs during this admission  Component     Latest Ref Rng 05/24/2015         9:57 AM  Lactic Acid, Venous     0.5 - 2.0 mmol/L 2.25 (HH)   Component     Latest Ref Rng 06/10/2015          Lactic Acid, Venous     0.5 - 2.0 mmol/L 0.7    Component     Latest Ref Rng 06/07/2015         9:50 AM  WBC     4.0 - 10.5 K/uL 13.4 (H)    Component     Latest Ref Rng 05/26/2015 06/09/2015 06/10/2015         3:33 PM    WBC     4.0 - 10.5 K/uL 11.6 (H) 9.1 11.5 (H)    Thank You, Serena Colonel ,RN Clinical Documentation Specialist:  [facility] [employer]

## 2015-06-10 NOTE — Progress Notes (Signed)
Throughout night pt's HR remained ST 110s-120s with occasional surges to 130s-140s with exertion. This morning pt's HR is trending up and sustaining 130s-140s more frequently without exertion. Pt, while still arousable and oriented is more lethargic and sleeping more. UOP minimal despite aggressive IV Lasix. Will notify Attending on-call and page family of change.

## 2015-06-13 LAB — URINE CULTURE

## 2015-06-16 NOTE — Discharge Summary (Addendum)
  Death Summary  [name] CNO:[id number] DOB: 09-Feb-1941 DOA: 06-12-2015  PCP: PROVIDER NOT IN SYSTEM PCP/Office notified:   Admit date: 2015/06/12 Date of Death: not documented in the chart when pt passed away   Final Diagnoses:  Active Problems:   Atrial fibrillation   Long term current use of anticoagulant therapy   Renal hematoma   Renal hemorrhage, right   Benign localized hyperplasia of prostate with urinary obstruction   Renal cyst, acquired, right   Renal cyst, acquired, left   Brief narrative:    74 year old male with A-fib and PE (pulmonary embolism), CKD stage 4-5, presented to [facility] ED with main concern of right sided flank pain and abd swelling several days in duration associated with near syncope.   In the ED CT scan of the abdomen and pelvis showed large hematoma involving the right kidney with extension into the perinephric space, retroperitoneum and the peritoneal space. Urology and nephrology teams consulted and [facility] asked to admit for further evaluation.   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 Ruptured kidney cysts - no urological intervention indicated  Acute thrombocytopenia  - in the setting of acute illness Severe sepsis present on admission  - pt met criteria for sepsis with T 91.2, HR up to 120, RR in 30's and SBP in 60's, ABC 13 K, elevated lactic acid  -  source UTI, Morganella Morgani in urine culture  - started empiric Zosyn but wife and pt very clear that comfort is the goal and asked to stop all IV medications other than analgesia  Hyperkalemia - in the setting of bleeding and CKD - supportive care  Metabolic acidosis  - secondary to principal problem A-fib on AC Obesity  - Body mass index is 31.24 kg/(m^2).  Code Status: DNR  IV access:  Peripheral IV  Procedures and diagnostic studies:   Ct Abdomen Pelvis Wo Contrast 06-12-2015 Large hematoma involving the right kidney with extension into the perinephric space, the retroperitoneum, and the peritoneal space as there is moderate hemoperitoneum. 2. Innumerable cysts involving both kidneys, many of which are hemorrhagic. A ruptured hemorrhagic cyst from the right kidney may be the source of the hemorrhage. There is no other identifiable source for the hemorrhage. 3. Stable marked prostate gland enlargement dating back to 2011. 4. Very small right pleural effusion. 5. Right femoral central venous catheter tip in the right external iliac vein.  Dg Chest Portable 1 View 12-Jun-2015 No active disease.   Medical Consultants:  Nephrology Urology PCT   Faye Ramsay, MD [facility] Pager [phone]  If 7PM-7AM, please contact night-coverage [email or web address] Password [email or web address]     Time: 30 minutes   Signed:  MAGICK-Srihan Brutus  [facility] 06/15/2015, 8:45 AM

## 2015-06-16 NOTE — Progress Notes (Signed)
Wasting dilaudid drip=58mL witness by Paulo Fruit and Skipper Cliche

## 2015-06-16 DEATH — deceased

## 2015-06-23 ENCOUNTER — Ambulatory Visit: Payer: Self-pay | Admitting: Cardiology

## 2015-06-23 DIAGNOSIS — Z7901 Long term (current) use of anticoagulants: Secondary | ICD-10-CM

## 2015-06-23 DIAGNOSIS — I480 Paroxysmal atrial fibrillation: Secondary | ICD-10-CM
